# Patient Record
Sex: Female | Born: 1935 | Race: Black or African American | Hispanic: No | Smoking: Former smoker
Health system: Southern US, Community
[De-identification: ages and names within clinical notes are randomized; demographics above are authoritative.]

## PROBLEM LIST (undated history)

## (undated) DIAGNOSIS — F482 Pseudobulbar affect: Secondary | ICD-10-CM

## (undated) DIAGNOSIS — E785 Hyperlipidemia, unspecified: Secondary | ICD-10-CM

## (undated) DIAGNOSIS — I609 Nontraumatic subarachnoid hemorrhage, unspecified: Secondary | ICD-10-CM

## (undated) DIAGNOSIS — K5900 Constipation, unspecified: Secondary | ICD-10-CM

## (undated) DIAGNOSIS — F419 Anxiety disorder, unspecified: Secondary | ICD-10-CM

## (undated) DIAGNOSIS — N183 Chronic kidney disease, stage 3 unspecified: Secondary | ICD-10-CM

## (undated) DIAGNOSIS — I5032 Chronic diastolic (congestive) heart failure: Secondary | ICD-10-CM

## (undated) DIAGNOSIS — I10 Essential (primary) hypertension: Secondary | ICD-10-CM

## (undated) DIAGNOSIS — G459 Transient cerebral ischemic attack, unspecified: Secondary | ICD-10-CM

## (undated) DIAGNOSIS — G309 Alzheimer's disease, unspecified: Secondary | ICD-10-CM

## (undated) DIAGNOSIS — F0280 Dementia in other diseases classified elsewhere without behavioral disturbance: Secondary | ICD-10-CM

## (undated) HISTORY — PX: OTHER SURGICAL HISTORY: SHX169

---

## 1997-12-16 ENCOUNTER — Emergency Department (HOSPITAL_COMMUNITY): Admission: EM | Admit: 1997-12-16 | Discharge: 1997-12-16 | Payer: Self-pay | Admitting: Emergency Medicine

## 1997-12-19 ENCOUNTER — Emergency Department (HOSPITAL_COMMUNITY): Admission: EM | Admit: 1997-12-19 | Discharge: 1997-12-19 | Payer: Self-pay | Admitting: Emergency Medicine

## 1997-12-31 ENCOUNTER — Encounter: Payer: Self-pay | Admitting: Neurosurgery

## 1997-12-31 ENCOUNTER — Inpatient Hospital Stay (HOSPITAL_COMMUNITY): Admission: RE | Admit: 1997-12-31 | Discharge: 1998-01-01 | Payer: Self-pay | Admitting: Neurosurgery

## 2000-03-14 ENCOUNTER — Other Ambulatory Visit: Admission: RE | Admit: 2000-03-14 | Discharge: 2000-03-14 | Payer: Self-pay | Admitting: Internal Medicine

## 2000-08-10 ENCOUNTER — Emergency Department (HOSPITAL_COMMUNITY): Admission: EM | Admit: 2000-08-10 | Discharge: 2000-08-10 | Payer: Self-pay | Admitting: Emergency Medicine

## 2000-08-10 ENCOUNTER — Encounter: Payer: Self-pay | Admitting: Emergency Medicine

## 2000-08-15 ENCOUNTER — Encounter: Admission: RE | Admit: 2000-08-15 | Discharge: 2000-08-15 | Payer: Self-pay | Admitting: Internal Medicine

## 2000-08-15 ENCOUNTER — Encounter: Payer: Self-pay | Admitting: Internal Medicine

## 2002-04-16 ENCOUNTER — Encounter: Payer: Self-pay | Admitting: Urology

## 2002-04-16 ENCOUNTER — Encounter: Admission: RE | Admit: 2002-04-16 | Discharge: 2002-04-16 | Payer: Self-pay | Admitting: Urology

## 2003-03-31 ENCOUNTER — Ambulatory Visit (HOSPITAL_COMMUNITY): Admission: RE | Admit: 2003-03-31 | Discharge: 2003-03-31 | Payer: Self-pay | Admitting: Neurosurgery

## 2003-04-28 ENCOUNTER — Ambulatory Visit (HOSPITAL_COMMUNITY): Admission: RE | Admit: 2003-04-28 | Discharge: 2003-04-28 | Payer: Self-pay | Admitting: Obstetrics and Gynecology

## 2004-04-09 ENCOUNTER — Ambulatory Visit (HOSPITAL_COMMUNITY): Admission: RE | Admit: 2004-04-09 | Discharge: 2004-04-09 | Payer: Self-pay | Admitting: Obstetrics and Gynecology

## 2004-06-08 ENCOUNTER — Other Ambulatory Visit: Admission: RE | Admit: 2004-06-08 | Discharge: 2004-06-08 | Payer: Self-pay | Admitting: Obstetrics and Gynecology

## 2004-09-11 ENCOUNTER — Inpatient Hospital Stay (HOSPITAL_COMMUNITY): Admission: EM | Admit: 2004-09-11 | Discharge: 2004-09-22 | Payer: Self-pay | Admitting: Emergency Medicine

## 2004-09-24 ENCOUNTER — Inpatient Hospital Stay (HOSPITAL_COMMUNITY): Admission: EM | Admit: 2004-09-24 | Discharge: 2004-09-28 | Payer: Self-pay | Admitting: Emergency Medicine

## 2004-09-24 ENCOUNTER — Encounter: Admission: RE | Admit: 2004-09-24 | Discharge: 2004-09-29 | Payer: Self-pay | Admitting: Neurosurgery

## 2004-09-24 ENCOUNTER — Ambulatory Visit: Payer: Self-pay | Admitting: Physical Medicine & Rehabilitation

## 2004-09-27 ENCOUNTER — Encounter (INDEPENDENT_AMBULATORY_CARE_PROVIDER_SITE_OTHER): Payer: Self-pay | Admitting: Cardiology

## 2006-12-22 ENCOUNTER — Inpatient Hospital Stay (HOSPITAL_COMMUNITY): Admission: EM | Admit: 2006-12-22 | Discharge: 2006-12-25 | Payer: Self-pay | Admitting: Emergency Medicine

## 2006-12-25 ENCOUNTER — Encounter (INDEPENDENT_AMBULATORY_CARE_PROVIDER_SITE_OTHER): Payer: Self-pay | Admitting: Emergency Medicine

## 2006-12-25 ENCOUNTER — Ambulatory Visit: Payer: Self-pay | Admitting: Vascular Surgery

## 2007-10-24 ENCOUNTER — Encounter: Admission: RE | Admit: 2007-10-24 | Discharge: 2008-01-22 | Payer: Self-pay | Admitting: Internal Medicine

## 2007-12-26 ENCOUNTER — Encounter: Admission: RE | Admit: 2007-12-26 | Discharge: 2008-01-21 | Payer: Self-pay | Admitting: Internal Medicine

## 2008-09-24 ENCOUNTER — Inpatient Hospital Stay (HOSPITAL_COMMUNITY): Admission: EM | Admit: 2008-09-24 | Discharge: 2008-10-03 | Payer: Self-pay | Admitting: Emergency Medicine

## 2008-09-25 ENCOUNTER — Encounter (INDEPENDENT_AMBULATORY_CARE_PROVIDER_SITE_OTHER): Payer: Self-pay | Admitting: Internal Medicine

## 2008-09-25 ENCOUNTER — Ambulatory Visit: Payer: Self-pay | Admitting: Vascular Surgery

## 2008-09-25 ENCOUNTER — Encounter (INDEPENDENT_AMBULATORY_CARE_PROVIDER_SITE_OTHER): Payer: Self-pay | Admitting: *Deleted

## 2009-01-28 ENCOUNTER — Emergency Department (HOSPITAL_COMMUNITY): Admission: EM | Admit: 2009-01-28 | Discharge: 2009-01-28 | Payer: Self-pay | Admitting: Emergency Medicine

## 2009-07-17 ENCOUNTER — Inpatient Hospital Stay (HOSPITAL_COMMUNITY): Admission: EM | Admit: 2009-07-17 | Discharge: 2009-07-24 | Payer: Self-pay | Admitting: Emergency Medicine

## 2009-07-18 ENCOUNTER — Ambulatory Visit: Payer: Self-pay | Admitting: Gastroenterology

## 2009-07-21 ENCOUNTER — Encounter: Payer: Self-pay | Admitting: Internal Medicine

## 2009-10-11 ENCOUNTER — Emergency Department (HOSPITAL_COMMUNITY)
Admission: EM | Admit: 2009-10-11 | Discharge: 2009-10-11 | Payer: Self-pay | Source: Home / Self Care | Admitting: Emergency Medicine

## 2010-05-16 ENCOUNTER — Emergency Department (HOSPITAL_COMMUNITY)
Admission: EM | Admit: 2010-05-16 | Discharge: 2010-05-17 | Payer: Self-pay | Source: Home / Self Care | Admitting: Emergency Medicine

## 2010-05-24 LAB — CBC
HCT: 43.6 % (ref 36.0–46.0)
Hemoglobin: 14.3 g/dL (ref 12.0–15.0)
MCH: 30.9 pg (ref 26.0–34.0)
MCHC: 32.8 g/dL (ref 30.0–36.0)
MCV: 94.2 fL (ref 78.0–100.0)
Platelets: 214 10*3/uL (ref 150–400)
RBC: 4.63 MIL/uL (ref 3.87–5.11)
RDW: 14 % (ref 11.5–15.5)
WBC: 5.9 10*3/uL (ref 4.0–10.5)

## 2010-05-24 LAB — URINALYSIS, ROUTINE W REFLEX MICROSCOPIC
Nitrite: NEGATIVE
Protein, ur: 100 mg/dL — AB
Specific Gravity, Urine: 1.017 (ref 1.005–1.030)
Urine Glucose, Fasting: NEGATIVE mg/dL
Urobilinogen, UA: 1 mg/dL (ref 0.0–1.0)
pH: 8.5 — ABNORMAL HIGH (ref 5.0–8.0)

## 2010-05-24 LAB — COMPREHENSIVE METABOLIC PANEL
ALT: 12 U/L (ref 0–35)
AST: 25 U/L (ref 0–37)
Albumin: 3.7 g/dL (ref 3.5–5.2)
Alkaline Phosphatase: 79 U/L (ref 39–117)
BUN: 19 mg/dL (ref 6–23)
CO2: 27 mEq/L (ref 19–32)
Calcium: 9.8 mg/dL (ref 8.4–10.5)
Chloride: 106 mEq/L (ref 96–112)
Creatinine, Ser: 1.35 mg/dL — ABNORMAL HIGH (ref 0.4–1.2)
GFR calc Af Amer: 45 mL/min — ABNORMAL LOW (ref >60)
GFR calc non Af Amer: 37 mL/min — ABNORMAL LOW (ref >60)
Glucose, Bld: 134 mg/dL — ABNORMAL HIGH (ref 70–99)
Potassium: 4.7 mEq/L (ref 3.5–5.1)
Sodium: 145 mEq/L (ref 135–145)
Total Bilirubin: 1.2 mg/dL (ref 0.3–1.2)
Total Protein: 7.2 g/dL (ref 6.0–8.3)

## 2010-05-24 LAB — URINE MICROSCOPIC-ADD ON

## 2010-05-24 LAB — DIFFERENTIAL
Basophils Absolute: 0 10*3/uL (ref 0.0–0.1)
Basophils Relative: 0 % (ref 0–1)
Eosinophils Absolute: 0.1 10*3/uL (ref 0.0–0.7)
Eosinophils Relative: 2 % (ref 0–5)
Lymphocytes Relative: 28 % (ref 12–46)
Lymphs Abs: 1.7 10*3/uL (ref 0.7–4.0)
Monocytes Absolute: 0.6 10*3/uL (ref 0.1–1.0)
Monocytes Relative: 11 % (ref 3–12)
Neutro Abs: 3.5 10*3/uL (ref 1.7–7.7)
Neutrophils Relative %: 60 % (ref 43–77)

## 2010-05-28 ENCOUNTER — Emergency Department (HOSPITAL_COMMUNITY)
Admission: EM | Admit: 2010-05-28 | Discharge: 2010-05-28 | Payer: Self-pay | Source: Home / Self Care | Admitting: Emergency Medicine

## 2010-05-31 LAB — POCT CARDIAC MARKERS
CKMB, poc: 3.2 ng/mL (ref 1.0–8.0)
CKMB, poc: 2.3 ng/mL (ref 1.0–8.0)
Myoglobin, poc: 265 ng/mL (ref 12–200)
Myoglobin, poc: 310 ng/mL (ref 12–200)

## 2010-05-31 LAB — RAPID URINE DRUG SCREEN, HOSP PERFORMED
Amphetamines: NOT DETECTED
Benzodiazepines: NOT DETECTED
Tetrahydrocannabinol: NOT DETECTED

## 2010-05-31 LAB — CBC
HCT: 43.8 % (ref 36.0–46.0)
Hemoglobin: 15.2 g/dL — ABNORMAL HIGH (ref 12.0–15.0)
MCHC: 34.7 g/dL (ref 30.0–36.0)
RBC: 4.86 MIL/uL (ref 3.87–5.11)
RDW: 13.8 % (ref 11.5–15.5)
WBC: 6.4 10*3/uL (ref 4.0–10.5)

## 2010-05-31 LAB — TROPONIN I: Troponin I: 0.01 ng/mL (ref 0.00–0.06)

## 2010-05-31 LAB — DIFFERENTIAL
Basophils Absolute: 0 10*3/uL (ref 0.0–0.1)
Eosinophils Relative: 1 % (ref 0–5)
Lymphs Abs: 2 10*3/uL (ref 0.7–4.0)
Neutro Abs: 3.6 10*3/uL (ref 1.7–7.7)
Neutrophils Relative %: 56 % (ref 43–77)

## 2010-05-31 LAB — URINALYSIS, ROUTINE W REFLEX MICROSCOPIC
Protein, ur: NEGATIVE mg/dL
Urobilinogen, UA: 1 mg/dL (ref 0.0–1.0)
pH: 5.5 (ref 5.0–8.0)

## 2010-05-31 LAB — URINE MICROSCOPIC-ADD ON

## 2010-06-01 LAB — URINE CULTURE: Colony Count: >=100000

## 2010-06-01 LAB — POCT I-STAT, CHEM 8
BUN: 14 mg/dL (ref 6–23)
Calcium, Ion: 1 mmol/L — ABNORMAL LOW (ref 1.12–1.32)
Creatinine, Ser: 1.3 mg/dL — ABNORMAL HIGH (ref 0.4–1.2)
Glucose, Bld: 114 mg/dL — ABNORMAL HIGH (ref 70–99)
TCO2: 28 mmol/L (ref 0–100)

## 2010-06-08 NOTE — Procedures (Signed)
Summary: Upper Endoscopy  Patient: [name] Note: All result statuses are Final unless otherwise noted.  Tests: (1) Upper Endoscopy (EGD)   EGD Upper Endoscopy       DONE     [facility]     [address]     [place], [place]  [address]           ENDOSCOPY PROCEDURE REPORT           PATIENT:  Wanda Hughes, Wanda Hughes  MR#:  [id number]     BIRTHDATE:  June 10, 1924, 85 yrs. old  GENDER:  female           ENDOSCOPIST:  Iva Boop, MD, [facility]     Referred by:  [facility]           PROCEDURE DATE:  07/21/2009     PROCEDURE:  EGD, diagnostic     ASA CLASS:  Class III     INDICATIONS:  hemeoccult positive stool, iron deficiency anemia           MEDICATIONS:   Fentanyl 40 mcg IV, Versed 3 mg IV     TOPICAL ANESTHETIC:  none           DESCRIPTION OF PROCEDURE:   After the risks benefits and     alternatives of the procedure were thoroughly explained, informed     consent was obtained.  The EG-[id number] ([id number]) and [id number]     ([id number]) endoscope was introduced through the mouth and advanced     to the second portion of the duodenum, without limitations.  The     instrument was slowly withdrawn as the mucosa was fully examined.     <<PROCEDUREIMAGES>>           A hiatal hernia was found. It was 3 cm in size. 37-40 cm     Otherwise the examination was normal.    Retroflexed views revealed     a hiatal hernia.    The scope was then withdrawn from the patient     and the procedure completed.           COMPLICATIONS:  None           ENDOSCOPIC IMPRESSION:     1) 3 cm hiatal hernia     2) Otherwise normal examination - no cause of heme +     iron-deficiency anemia seen     RECOMMENDATIONS:     More prep for colonoscopy - brief look with colonoscope into     rectum revealed inadequate prep.           REPEAT EXAM:  as needed           Iva Boop, MD, [facility]           CC:  Jackie Plum MD           n.     Rosalie Doctor:   Iva Boop at 07/21/2009 12:30  PM           Wengert, Okey Dupre, [phone]  Note: An exclamation mark (!) indicates a result that was not dispersed into the flowsheet. Document Creation Date: 07/21/2009 12:31 PM _______________________________________________________________________  (1) Order result status: Final Collection or observation date-time: 07/21/2009 12:25 Requested date-time:  Receipt date-time:  Reported date-time:  Referring Physician:   Ordering Physician: Stan Head [phone]) Specimen Source:  Source: Launa Grill Order Number: [id number] Lab site:

## 2010-08-01 LAB — CBC
HCT: 30.4 % — ABNORMAL LOW (ref 36.0–46.0)
HCT: 30.5 % — ABNORMAL LOW (ref 36.0–46.0)
HCT: 33 % — ABNORMAL LOW (ref 36.0–46.0)
HCT: 34.6 % — ABNORMAL LOW (ref 36.0–46.0)
Hemoglobin: 7.1 g/dL — ABNORMAL LOW (ref 12.0–15.0)
Hemoglobin: 9.7 g/dL — ABNORMAL LOW (ref 12.0–15.0)
MCHC: 30.7 g/dL (ref 30.0–36.0)
MCHC: 31.2 g/dL (ref 30.0–36.0)
MCHC: 31.5 g/dL (ref 30.0–36.0)
MCHC: 31.8 g/dL (ref 30.0–36.0)
MCV: 70.6 fL — ABNORMAL LOW (ref 78.0–100.0)
MCV: 71 fL — ABNORMAL LOW (ref 78.0–100.0)
MCV: 71.8 fL — ABNORMAL LOW (ref 78.0–100.0)
MCV: 72.4 fL — ABNORMAL LOW (ref 78.0–100.0)
MCV: 72.5 fL — ABNORMAL LOW (ref 78.0–100.0)
Platelets: 202 10*3/uL (ref 150–400)
Platelets: 206 10*3/uL (ref 150–400)
Platelets: 210 10*3/uL (ref 150–400)
Platelets: 229 10*3/uL (ref 150–400)
RBC: 3.35 MIL/uL — ABNORMAL LOW (ref 3.87–5.11)
RBC: 4.32 MIL/uL (ref 3.87–5.11)
RBC: 4.55 MIL/uL (ref 3.87–5.11)
RBC: 4.78 MIL/uL (ref 3.87–5.11)
RDW: 20 % — ABNORMAL HIGH (ref 11.5–15.5)
WBC: 3.7 10*3/uL — ABNORMAL LOW (ref 4.0–10.5)
WBC: 3.7 10*3/uL — ABNORMAL LOW (ref 4.0–10.5)
WBC: 4.5 10*3/uL (ref 4.0–10.5)
WBC: 6.1 10*3/uL (ref 4.0–10.5)

## 2010-08-01 LAB — CROSSMATCH

## 2010-08-01 LAB — BASIC METABOLIC PANEL
BUN: 11 mg/dL (ref 6–23)
BUN: 14 mg/dL (ref 6–23)
BUN: 16 mg/dL (ref 6–23)
CO2: 28 mEq/L (ref 19–32)
CO2: 29 mEq/L (ref 19–32)
CO2: 30 mEq/L (ref 19–32)
Calcium: 9.2 mg/dL (ref 8.4–10.5)
Calcium: 9.4 mg/dL (ref 8.4–10.5)
Chloride: 103 mEq/L (ref 96–112)
Chloride: 103 mEq/L (ref 96–112)
Chloride: 105 mEq/L (ref 96–112)
Chloride: 109 mEq/L (ref 96–112)
Creatinine, Ser: 1.03 mg/dL (ref 0.4–1.2)
Creatinine, Ser: 1.08 mg/dL (ref 0.4–1.2)
Creatinine, Ser: 1.18 mg/dL (ref 0.4–1.2)
Creatinine, Ser: 1.38 mg/dL — ABNORMAL HIGH (ref 0.4–1.2)
GFR calc Af Amer: 55 mL/min — ABNORMAL LOW (ref >60)
GFR calc Af Amer: 59 mL/min — ABNORMAL LOW (ref >60)
GFR calc non Af Amer: 48 mL/min — ABNORMAL LOW (ref >60)
Potassium: 3.3 mEq/L — ABNORMAL LOW (ref 3.5–5.1)
Potassium: 3.4 mEq/L — ABNORMAL LOW (ref 3.5–5.1)
Potassium: 3.7 mEq/L (ref 3.5–5.1)
Sodium: 142 mEq/L (ref 135–145)
Sodium: 144 mEq/L (ref 135–145)

## 2010-08-01 LAB — DIFFERENTIAL
Band Neutrophils: 0 % (ref 0–10)
Eosinophils Absolute: 0.1 10*3/uL (ref 0.0–0.7)
Eosinophils Relative: 4 % (ref 0–5)
Metamyelocytes Relative: 0 %
Monocytes Absolute: 0.1 10*3/uL (ref 0.1–1.0)
Monocytes Relative: 4 % (ref 3–12)
Myelocytes: 0 %
nRBC: 0 /100 WBC

## 2010-08-01 LAB — COMPREHENSIVE METABOLIC PANEL
ALT: 24 U/L (ref 0–35)
AST: 25 U/L (ref 0–37)
Alkaline Phosphatase: 88 U/L (ref 39–117)
CO2: 29 mEq/L (ref 19–32)
GFR calc Af Amer: 57 mL/min — ABNORMAL LOW (ref >60)
GFR calc non Af Amer: 47 mL/min — ABNORMAL LOW (ref >60)
Glucose, Bld: 146 mg/dL — ABNORMAL HIGH (ref 70–99)
Potassium: 3.7 mEq/L (ref 3.5–5.1)
Sodium: 142 mEq/L (ref 135–145)
Total Protein: 6.7 g/dL (ref 6.0–8.3)

## 2010-08-01 LAB — POCT CARDIAC MARKERS
CKMB, poc: 1.7 ng/mL (ref 1.0–8.0)
Troponin i, poc: <0.05 ng/mL (ref 0.00–0.09)

## 2010-08-01 LAB — HEMOGLOBIN AND HEMATOCRIT, BLOOD
HCT: 31.3 % — ABNORMAL LOW (ref 36.0–46.0)
HCT: 31.8 % — ABNORMAL LOW (ref 36.0–46.0)
Hemoglobin: 9.9 g/dL — ABNORMAL LOW (ref 12.0–15.0)

## 2010-08-01 LAB — PROTIME-INR
INR: 1.04 (ref 0.00–1.49)
Prothrombin Time: 13.5 seconds (ref 11.6–15.2)

## 2010-08-01 LAB — FERRITIN: Ferritin: 14 ng/mL (ref 10–291)

## 2010-08-01 LAB — IRON AND TIBC
Iron: 38 ug/dL — ABNORMAL LOW (ref 42–135)
TIBC: 406 ug/dL (ref 250–470)

## 2010-08-01 LAB — APTT: aPTT: 26 seconds (ref 24–37)

## 2010-08-17 LAB — URINALYSIS, DIPSTICK ONLY
Ketones, ur: 15 mg/dL — AB
Nitrite: NEGATIVE
Specific Gravity, Urine: 1.023 (ref 1.005–1.030)
pH: 5 (ref 5.0–8.0)

## 2010-08-17 LAB — CBC
HCT: 34.4 % — ABNORMAL LOW (ref 36.0–46.0)
HCT: 43.9 % (ref 36.0–46.0)
Hemoglobin: 12.9 g/dL (ref 12.0–15.0)
MCHC: 33.5 g/dL (ref 30.0–36.0)
MCHC: 33.6 g/dL (ref 30.0–36.0)
MCV: 89.5 fL (ref 78.0–100.0)
Platelets: 123 10*3/uL — ABNORMAL LOW (ref 150–400)
Platelets: 200 10*3/uL (ref 150–400)
RBC: 4.26 MIL/uL (ref 3.87–5.11)
RDW: 14.6 % (ref 11.5–15.5)
WBC: 5 10*3/uL (ref 4.0–10.5)
WBC: 7.2 10*3/uL (ref 4.0–10.5)

## 2010-08-17 LAB — URINALYSIS, ROUTINE W REFLEX MICROSCOPIC
Bilirubin Urine: NEGATIVE
Glucose, UA: NEGATIVE mg/dL
Protein, ur: NEGATIVE mg/dL
Urobilinogen, UA: 0.2 mg/dL (ref 0.0–1.0)

## 2010-08-17 LAB — CULTURE, BLOOD (ROUTINE X 2)
Culture: NO GROWTH
Culture: NO GROWTH
Culture: NO GROWTH

## 2010-08-17 LAB — COMPREHENSIVE METABOLIC PANEL
ALT: 11 U/L (ref 0–35)
CO2: 29 mEq/L (ref 19–32)
Calcium: 9.9 mg/dL (ref 8.4–10.5)
Creatinine, Ser: 1.06 mg/dL (ref 0.4–1.2)
GFR calc non Af Amer: 50 mL/min — ABNORMAL LOW (ref >60)
Glucose, Bld: 81 mg/dL (ref 70–99)
Sodium: 144 mEq/L (ref 135–145)
Total Protein: 7.6 g/dL (ref 6.0–8.3)

## 2010-08-17 LAB — URINE CULTURE

## 2010-08-17 LAB — BASIC METABOLIC PANEL
BUN: 16 mg/dL (ref 6–23)
CO2: 26 mEq/L (ref 19–32)
CO2: 26 mEq/L (ref 19–32)
Calcium: 8.9 mg/dL (ref 8.4–10.5)
Chloride: 112 mEq/L (ref 96–112)
Creatinine, Ser: 1.09 mg/dL (ref 0.4–1.2)
Creatinine, Ser: 1.12 mg/dL (ref 0.4–1.2)
GFR calc Af Amer: 56 mL/min — ABNORMAL LOW (ref >60)
GFR calc non Af Amer: 47 mL/min — ABNORMAL LOW (ref >60)
Potassium: 3.4 mEq/L — ABNORMAL LOW (ref 3.5–5.1)
Sodium: 145 mEq/L (ref 135–145)

## 2010-08-17 LAB — URINE MICROSCOPIC-ADD ON

## 2010-08-17 LAB — POCT CARDIAC MARKERS
CKMB, poc: 2.1 ng/mL (ref 1.0–8.0)
Myoglobin, poc: 185 ng/mL (ref 12–200)

## 2010-08-17 LAB — GLUCOSE, CAPILLARY: Glucose-Capillary: 70 mg/dL (ref 70–99)

## 2010-08-17 LAB — POCT I-STAT 3, ART BLOOD GAS (G3+)
Bicarbonate: 29.3 mEq/L — ABNORMAL HIGH (ref 20.0–24.0)
TCO2: 31 mmol/L (ref 0–100)
pCO2 arterial: 43.9 mmHg (ref 35.0–45.0)
pH, Arterial: 7.432 — ABNORMAL HIGH (ref 7.350–7.400)
pO2, Arterial: 75 mmHg — ABNORMAL LOW (ref 80.0–100.0)

## 2010-08-17 LAB — DIFFERENTIAL
Lymphocytes Relative: 38 % (ref 12–46)
Lymphs Abs: 1.4 10*3/uL (ref 0.7–4.0)
Monocytes Relative: 7 % (ref 3–12)
Neutro Abs: 2.1 10*3/uL (ref 1.7–7.7)
Neutrophils Relative %: 54 % (ref 43–77)

## 2010-08-17 LAB — PROTIME-INR: INR: 1 (ref 0.00–1.49)

## 2010-08-17 LAB — MAGNESIUM: Magnesium: 2.2 mg/dL (ref 1.5–2.5)

## 2010-08-17 LAB — TSH: TSH: 3.036 u[IU]/mL (ref 0.350–4.500)

## 2010-08-17 LAB — LACTIC ACID, PLASMA: Lactic Acid, Venous: 1.4 mmol/L (ref 0.5–2.2)

## 2010-08-17 LAB — HEMOGLOBIN A1C: Mean Plasma Glucose: 128 mg/dL

## 2010-08-17 LAB — LIPID PANEL
HDL: 47 mg/dL (ref >39)
LDL Cholesterol: 226 mg/dL — ABNORMAL HIGH (ref 0–99)
Triglycerides: 150 mg/dL — ABNORMAL HIGH (ref <150)

## 2010-08-17 LAB — CALCIUM: Calcium: 8.9 mg/dL (ref 8.4–10.5)

## 2010-08-17 LAB — APTT: aPTT: 25 seconds (ref 24–37)

## 2010-09-21 NOTE — H&P (Signed)
NAME:  Wanda Hughes, Wanda Hughes                   ACCOUNT NO.:  [id number]   MEDICAL RECORD NO.:  [id number]          PATIENT TYPE:  INP   LOCATION:  1826                         FACILITY:  [facility]   PHYSICIAN:  Lonia Blood, M.D.       DATE OF BIRTH:  1924/12/29   DATE OF ADMISSION:  12/22/2006  DATE OF DISCHARGE:                              HISTORY & PHYSICAL   PRIMARY CARE PHYSICIAN:  Dr. Renae Gloss   CHIEF COMPLAINT:  Passed out.   HISTORY OF PRESENT ILLNESS:  Wanda Hughes is an 77 year old woman who was  brought in by EMS for an episode of loss of consciousness at the  hairdresser.  According to the witnesses, the patient just passed out,  slumped in a chair and when she woke up, she could not talk well and she  was weak on the right side.  The patient herself has no recollection of  the event.  The sister was with her at the hairdresser and apparently  denied to the EMS presence of any shakiness, tongue biting.  Currently,  the patient reports to me that she feels perfect.  When asked about her  past medical history, the patient does not remember having a  subarachnoid hemorrhage in 2005, but the records indeed confirms that.  Also, past medical history is hypertension, appendectomy.  In the past,  the patient also had a syncopal event in May of 2006.   HOME MEDICATIONS:  The patient claims that she takes a blood pressure  three times a day and she does not remember the name.  When I called her  pharmacy, they tell me that patient has been on Hyzaar 100/25, but she  has not filled it since May.   SOCIAL HISTORY:  Patient lives alone.  She is retired from [employer].  She has three sons; they all live out of state.  She reports that her  granddaughter stays with her at times.  She has been driving around town  and doing her activities of daily living alone.   FAMILY HISTORY:  Noncontributory.   REVIEW OF SYSTEMS:  Negative for chest pain, negative for current focal  weakness and numbness,  negative for nausea, vomiting, abdominal pain.  Positive for some urinary incontinence.   PHYSICAL EXAMINATION UP ADMISSION:  VITAL SIGNS:  Show a temperature of  97.2, blood pressure 134/57, pulse 67, respirations 18, saturation 100%  on room air.  GENERAL APPEARANCE:  Tall, well-developed African-American woman sitting  on a stretcher in no acute distress.  She is oriented to place; she  knows the year; she does not know the date, does not know the month,  does not know the day of the week.  HEENT:  Her head appears normocephalic, atraumatic and her pupils equal,  round and reactive to light and accommodation.  Extraocular movements  intact.  Throat clear.  NECK:  Supple, has +1 JVD.  CHEST:  Clear without wheeze, rhonchi, crackles.  HEART:  Regular rate and rhythm with 2/6 systolic murmur at the right  upper sternal border.  ABDOMEN:  Soft,  nontender, nondistended.  Bowel sounds are present.  No  palpable hepatosplenomegaly.  EXTREMITIES:  Lower extremities have no edema.  NEUROLOGICAL EXAMINATION:  Patient has intact repetition speech, short  memory; she will remember 2/3 objects.  Cranial nerves III-XII are  intact.  Sensation intact in all four extremities.  Deep tendon reflexes  +1 and symmetric.  Strength 5/5.  Gait normal just shuffling slow, but  otherwise without any clear problems.  Finger-to-nose is intact.  No  cerebellar signs.   LABORATORY VALUES AT THE TIME OF ADMISSION:  Sodium 139, potassium 2.8,  chloride 104, BUN 19, creatinine 1.2.  White blood cell count 4.2,  hemoglobin 12.9, platelet count 181. Myoglobin 106.  Troponin I less  than 0.05.   Head CT did not show any acute changes.   ASSESSMENT/PLAN:  1. Syncope with probable transient ischemic attack.  The exact cause      of the patient's symptom is unclear.  She is on no medications to      account for her loss of consciousness.  She does not have extensive      coronary artery disease or valvulopathy to  account for an      arrhythmia or a sudden change in cardiac output.  She does not seem      to have sepsis or major infectious problem.  Her neurological exam      is currently within normal limits.  The patient may have had      orthostasis and we will check the orthostatics.  Other possibility      could be that the patient indeed had a transient ischemic attack or      a small lacunar stroke, so for this reason we will perform frequent      neurological examination and will obtain an MRI/MRA of the brain.      Carotid Dopplers will be done as well.  Transthoracic      echocardiogram will be obtain and will further evaluate the      systolic murmur which I suspect is related to some aortic      sclerosis.  2. Mild cognitive deficit.  Wanda Hughes seems to have some red flags that      may indicate that she is developing dementia.  Definitely will      pursue with the check of TSH, vitamin B12 and RPR and defer the      long-term planning to her primary care physician who may have a      better idea about the baseline of this patient.      Lonia Blood, M.D.  Electronically Signed     SL/MEDQ  D:  12/22/2006  T:  12/23/2006  Job:  [id number]

## 2010-09-21 NOTE — Discharge Summary (Signed)
NAME:  Wanda Hughes, Pasch Cherene                   ACCOUNT NO.:  [id number]   MEDICAL RECORD NO.:  [id number]          PATIENT TYPE:  INP   LOCATION:  3036                         FACILITY:  [facility]   PHYSICIAN:  Charlestine Massed, MDDATE OF BIRTH:  01-20-1925   DATE OF ADMISSION:  09/24/2008  DATE OF DISCHARGE:                               DISCHARGE SUMMARY   PRIMARY CARE PHYSICIAN:  Dr. Andi Devon as per previous records.   CURRENT DISPOSITION:  The patient will be discharged back to skilled  nursing facility.   REASON FOR ADMISSION:  Altered mental status.   DISCHARGE DIAGNOSES:  1. Moderate-to-severe dementia with significant memory impairment with      decreased level of functioning.  2. Urinary tract infection with sepsis, currently resolving.  3. Altered mental status secondary to moderate-to-severe dementia and      urinary tract infection with sepsis.  Currently, she is not      agitated.  4. Dyslipidemia.  5. Hypertension.  6. Previous history of subarachnoid hemorrhage and transient ischemic      attack.  7. Previous history of nonsustained ventricular tachycardia, currently      no issues.  8. History of right vertebral artery stenosis.   MEDICATIONS:  A full list of medications will be dictated at the time of  discharge.  The patient is currently on IV medications and that will be  changed and dictation will be done at the time of discharge for  medications.   HOSPITAL COURSE BY PROBLEM:  1. Altered mental status.  The altered mental status in her case is      multifactorial.      a.     Dementia with decreased level of functioning and memory       impairment.  The patient has had a prior history of dementia and       currently she has progressive dementia with decreased level of       function.  She is currently not able to take care of herself at       home.  Family was spoken to, her son agreed that she will need a       skilled nursing facility.  The list of  facilities have been given       to the family and a Child psychotherapist is working with them.  The       patient will be ready for discharge in another 1 day possibly,       pending urine culture report.      b.     Urinary tract infection with sepsis.  The patient had       urinary tract infection and was diagnosed with urinary tract       infection.  While during hospital stay, she was having fevers, was       started on Zosyn in view of the fact that her mental status       declined considerably.  After the antibiotics, the patient       improved very much and came back to baseline  level of functioning.       She is eating by herself.  No dysphagia.  The urine culture is       still not back.  I spoke to Microbiology Lab now and it will be       available only by tomorrow morning.  Pending urine cultures, the       patient was continued on Zosyn and can be changed to appropriate       p.o. antibiotics and can be discharged tomorrow if feasible as per       the urine culture.  2. Hypertension.  Currently blood pressure is stable.  We will      continue Norvasc and labetalol.  3. Dyslipidemia.  Continue Zetia and Zocor.  4. Dementia.  Continue trazodone 25 mg p.o. at bedtime, and we can      also continue Aricept and Namenda for her dementia.  Even though      there is not much data available, sporadic cases have demonstrated      some decrease progression of the dementia.   DISPOSITION:  The patient will be discharged back to skilled nursing  facility.   FOLLOWUP:  Follow up MD at skilled nursing facility to follow up the  patient and further checkups as deemed necessary to be decided by the MD  there.   A total of 40 minutes spent on the discharge evaluation today.      Charlestine Massed, MD  Electronically Signed     UT/MEDQ  D:  09/30/2008  T:  10/01/2008  Job:  [id number]

## 2010-09-21 NOTE — H&P (Signed)
NAME:  Wanda Hughes, Wanda Hughes                   ACCOUNT NO.:  [id number]   MEDICAL RECORD NO.:  [id number]          PATIENT TYPE:  EMS   LOCATION:  MAJO                         FACILITY:  [facility]   PHYSICIAN:  Manus Gunning, MD      DATE OF BIRTH:  04-09-1925   DATE OF ADMISSION:  09/24/2008  DATE OF DISCHARGE:                              HISTORY & PHYSICAL   CHIEF COMPLAINT:  Altered mental status.   HISTORY OF PRESENT ILLNESS:  Wanda Hughes is an 77 year old African American  female who was apparently brought to the emergency department by her  family as she was found confused and lying in a pool of her own urine.  During the time of my interview no family members were present by the  bedside so all history has been obtained either from ED sign-out or from  previous past medical records.  I have spoken with the patient.  She is  a very pleasant lady but appears to be confused.  She was disoriented to  time and place, oriented to person, confused about her date of birth.  Though she does get the year and month right the date itself is wrong.  She claims she does not know why she was brought to the emergency  department and apparently lives alone to the best of my knowledge.  She  cannot really provide any meaningful history though she does follow all  commands appropriately and is able to converse in an appropriate manner  as well.  She is easily confused and appears to suffer from mild to  moderate dementia at baseline, though this cannot be corroborated as  again no family members are at the bedside.  Also of note, She has three  pill boxes with her.  All three of the medications were filled in 2009  in apparently January and still have pills left in the boxes.  Her blood  pressure at the time of presentation was 224/83.  During my interview it  was 168/76 and her heart rate was in the low 60s.  A review of her EKG  demonstrates left anterior fascicular block with sinus rhythm, a heart  rate of  approximately 70 beats per minute.  The patient herself appears  to be like as mentioned appropriate, follows all commands, does not  appear to have any focal neurological deficits.  Her laboratory tests as  reviewed by self did not demonstrate any overt findings of concern, but  again based on her chief complaint, am unable to corroborate her  baseline mentation and her history.  At this time we will admit her to  the hospital.   PAST MEDICAL/SURGICAL HISTORY:  1. Appendectomy.  2. Hypertension.  3. Subarachnoid hemorrhage.  4. TIA.  5. Dyslipidemia.  6. Dementia.  7. History of non-sustained ventricular tachycardia.  8. History of right vertebral artery stenosis.   SOCIAL HISTORY:  Denies tobacco, illicits or alcohol.  Apparently lives  at home alone.  Has three sons who live out of town, and has  granddaughter who lives occasionally with her.  Again, this  is all from  previous histories.   FAMILY HISTORY:  Cannot be provided by the patient. She does not recall.   ALLERGIES:  No known drug allergies.Marland Kitchen   HOME MEDICATIONS:  To the best of my knowledge from the pill boxes that  are lying by her bedside which were all filled in January 2009 are:  1. Hyzaar 100/25.  2. Caduet 10/40.  3. Oxybutynin ER 5 mg.  All tablets to be taken 1daily.   REVIEW OF SYSTEMS:  Essentially 14-point review of systems performed  with the patient.  She denies loss of consciousness, denies syncope,  denies falls although the patient cannot recall why she is here.  Denies  chest pain, palpitations, PND, orthopnea.  No shortness of breath, cough  or expectoration.  Claims that she lives independently and can perform  her activities of daily living.  No abdominal pain, nausea, vomiting,  diarrhea, constipation, dysuria, polyuria, hematuria, bright red blood  per rectum or melanotic stool.  Denies any overt musculoskeletal  complaints at this time.   PHYSICAL EXAMINATION:  VITALS AT TIME OF  PRESENTATION:  Temperature  98.1, heart rate 59, respiratory rate 18, blood pressure 224/83.  O2  saturation 100% on room air.  GENERAL:  Well-nourished, well-developed African American lady lying in  bed comfortably in no apparent distress.  HEENT:  Normocephalic, atraumatic.  Dry oral mucosa.  No thrush,  erythema, postanal drip.  Eyes:  Anicteric.  Extraocular muscles intact.  Pupils are equal and react to light and accommodation.  CARDIOVASCULAR:  S1.  S2 is single, no split.  No murmurs, rubs or  gallops.  RS:  Air entry is bilaterally equal. No rales, rhonchi or wheezes.  ABDOMEN:  Soft, nontender, nondistended.  Positive bowel sounds.  No  organomegaly.  EXTREMITIES:  No cyanosis or clubbing.  +1 bilateral lower extremity  edema.  HEME/ONC:  No palpable lymphadenopathy, ecchymosis, bruising or  petechiae.  SKIN:  No breakdown, swelling or ulcerations, masses.  NECK:  Supple.  Good range of motion.  No carotid bruits.  Neck veins  appear to be flat.   LABORATORY TESTS:  UA demonstrates zero WBCs, RBCs 3-6.  There is mucous  present.  Urinalysis demonstrates negative nitrite,negative leukocytes.  Lactic acid 1.4.  Sodium 144, potassium 3.9, chloride 103, CO2 was 29,  glucose 81, BUN 15, creatinine 1.06, T-bili 1.4, alk phos 83, AST 16,  ALT 11, total protein 7.6, albumin 3.8, calcium 9.9, creatinine kinase  40.  PTT 25, myoglobin 185.  Troponin-I less than 0.05.  CK-MB 2.1.  WBC  3.9, hemoglobin 14.8, hematocrit 43.9, platelet count 123, poly morphs  54.  ABG demonstrates pH of 7.43, pCO2 of 43.9, pO2 of 75, bicarbonate  29.3, CO2 of 31.   ASSESSMENT/PLAN:  1. Altered mental status, apparently urinary incontinence.  Again,      history cannot be corroborated.  Patient is confused at baseline      during my interview.  Whether this is her actual baseline or      whether this is a new finding it cannot be corroborated.  I have      attempted to reach her next-of-kin who is Kellie Shropshire at [phone] but      unable to reach anybody.  At this time we will admit her and we      will rule her out for transient ischemic attack and obtain an MRI      of the head, ultrasound bilateral carotids and  two-dimensional      echocardiogram to assess systolic function and cardiac anatomy.  I      started her on aspirin 325 mg p.o. daily and allow permissive      hypertension.  Her systolic blood pressures were approximately 160      mmHg.  2. Hypertension.  At the time of presentation appears to be      hypertensive urgency and her presenting symptoms could be secondary      to encephalopathy secondary to high blood pressure.  At this time      as dictated above I will allow permissive hypertension and start      Norvasc 2.5 mg p.o. daily and titrate up to maximum tolerated to      assist and obtain a blood pressure of approximately 160 mmHg.      Gradually as an outpatient we can add on additional medications and      titrate to goal of approximately 110-120 mmHg.  3. Altered mental status and dementia.  The patient has a history of      dementia per past records.  Whether the current presentation is      based on or not I am unsure, though I would guess that she is at      her baseline based on pleasant conversation and ability to follow      all commands.  Regardless, her social status is unknown.  There are      no family members at the bedside.  She has medicines which have      been brought with her but again of note, these have been filled in      January of 2009 and August of 2009 and still have pills present in      the pill boxes.  We will obtain a case management consult and also      check physical therapy, occupational therapy and speech therapy      evaluation to asses patient's needs and possible placement if need      require until we can obtain a family member or we can figure out      how she lives at home.  4. Gastrointestinal/deep vein thrombosis prophylaxis.   Protonix 40 mg      p.o. daily and sequential compression devices for now.      Manus Gunning, MD  Electronically Signed     SP/MEDQ  D:  09/24/2008  T:  09/24/2008  Job:  [id number]

## 2010-09-21 NOTE — Discharge Summary (Signed)
NAME:  Wanda Hughes, Wanda Hughes                   ACCOUNT NO.:  [id number]   MEDICAL RECORD NO.:  [id number]          PATIENT TYPE:  INP   LOCATION:  6708                         FACILITY:  [facility]   PHYSICIAN:  Mobolaji B. Bakare, M.D.DATE OF BIRTH:  09-13-1924   DATE OF ADMISSION:  12/22/2006  DATE OF DISCHARGE:  12/25/2006                               DISCHARGE SUMMARY   PRIMARY CARE PHYSICIAN:  Cala Bradford R. Renae Gloss, M.D.   FINAL DIAGNOSIS:  1. Transient ischemic attack.  2. Hypertension.  3. Dyslipidemia.  4. Nonsustained ventricular tachycardia and bigeminy.  5. Urinary tract infection.  6. History of subarachnoid hemorrhage in May 2006.  7. Dementia.  8. History of right vertebral artery stenosis.   PROCEDURES:  1. Head CT scan done on December 22, 2006, showed no acute intracranial      process, no significant change from CT done in May 2006.      Periventricular right and subinsular white matter disease and      cortical atrophy.  2. MRA of the head done on December 22, 2006, showed no acute      intracranial abnormality.  No acute stroke. Chronic small vessel      disease.  3. MRA of the neck with contrast showed arteriosclerotic disease most      prominent in origin of the nondominant right vertebral artery with      significant stenosis. Distal E4 segment of the right vertebral      artery with high-grade stenosis, left common carotid origin without      hemodynamically significant stenosis but carotid bifurcations right      greater than left without hemodynamically significant stenosis.  4. MRA of the head showed atherosclerotic disease involving the      basilar artery and carotid siphons, right greater than left, with      moderate right subsphenoid and right internal carotid stenosis.      Distal posterior cerebral artery arteriosclerosis, right greater      than left.  Absent, hypoplastic right A1 variant.  5. Carotid Dopplers showed no significant internal carotid stenosis.  6.  A 2-D echocardiogram report is pending.   BRIEF HISTORY:  Wanda Hughes is a pleasant 77 year old African-American  female who was brought to the emergency room by EMS.  She was at the  hairdresser today.  The patient passed out, slumped over in the chair.  She was reported to have had difficulty talking and was weak on the  right side.  The patient could not recollect these events.  There was no  evidence of seizures.  The patient recovered before arrival in the  emergency room. At the time of evaluation in the emergency room, she had  no neurological deficits.  She was alert and oriented.  However, she had  some short-term memory loss noted.  Vital signs upon arrival revealed  temperature 97.2, blood pressure 154/70, pulse of 67, respiratory  rate  18, O2 saturation of 100% on room air. The patient had no focal  neurological deficits on evaluation. No cerebellar signs.  She was  subsequently admitted for TIA workup and stroke workup.   HOSPITAL COURSE:  #1.  TRANSIENT ISCHEMIC ATTACK:  Given the history, it was compatible  with transient ischemic attack.  She has risk factors of hypertension  and dyslipidemia.  The patient does not smoke cigarettes.  She is not  diabetic.  She had a normal TSH of 1.637. Hemoglobin A1c normal at 6.1.  Homocysteine level normal at 9.7.  Carotid Dopplers were unremarkable  for any significant lesion.  MRI noted vertebral artery stenosis which  was equally present on angiogram done in 2006, and she had  atherosclerotic disease.   The patient has not been on aspirin prior to hospitalization. She was  started on baby aspirin 81 mg daily.  Her blood pressure was relatively  controlled during the course of hospitalization but not optimal. She had  some values around 159-180 systolic. Atenolol was added to previous  medications which include Hyzaar/hydrochlorothiazide, amlodipine.  Further optimization of blood pressure will be deferred to Dr. Renae Gloss.    Additionally, fasting lipid profile showed total cholesterol of 176, HDL  of 48, LDL of 111.  She is currently on Caduet which she will continue,  and I have emphasized low-cholesterol diet. Follow up fasting lipid  profile in 2-3 months.   #2.  NONSUSTAINED VENTRICULAR TACHYCARDIA, PREMATURE VENTRICULAR  CONTRACTIONS, AND BIGEMINY:  The patient was on telemetry.  She had five  runs of nonsustained ventricular tachycardia.  She was asymptomatic.  She had intermittent bigeminy on telemetry and also frequent PVCs.  This  was not symptomatic. Blood pressure was normal.  The patient had no  palpitation.  A 2-D echocardiogram report is pending at the time of  dictation.  At the time of discharge, she was in normal sinus rhythm.  I  would mention that the patient is currently on atenolol to improve blood  pressure.   #3.  DEMENTIA:  The patient clearly demonstrated short-term memory loss.  It is noted that she is on Aricept. She had vitamin B12 and folate done  which were within normal.  RPR was not reactive. Concern is the  patient's ability to continue driving without posing a risk to herself  and to others.  I have recommended no driving until she sees Dr.  Renae Gloss, who is her  primary care physician and who is familiar with the  situation. The patient lives alone.  She was evaluated by physical and  occupational therapist, and she does not need further therapy at home.  The patient is independent with activities. Family will be able to  provide further 24-hour assistance for the next 2-3 weeks.  Her son [place]  down from [place] and will be able to assess her and  determine if she can remain at home or not.  If not, she will be seeing  Dr. Renae Gloss for further evaluation regarding assisted living facility.   #4.  URINARY TRACT INFECTION:  The patient had pyuria on admission with  positive nitrite and large leukocytes.  On urinalysis, microscopy showed  21-50 white blood cells  and many bacteria.  Unfortunately, urine culture  was not sent before antibiotic was administered. The patient was  empirically treated with ciprofloxacin.  She will complete treatment for  7 days.   DISCHARGE MEDICATIONS:  1. Baby aspirin 81 mg daily.  2. Ciprofloxacin 500 mg for three more days.  3. Atenolol 25 mg daily.  4. Hyzaar/hydrochlorothiazide 100/25 one daily.  5. Lyrica 75 mg daily.  6.  Caduet 10/40 daily.  7. Fosamax 1 weekly.  8. Ditropan XL  5 mg daily.  9. Aricept 10 mg nightly.  10.Meclizine 25 mg one to two tablets two to three times daily p.r.n.   DISCHARGE CONDITION:  Stable.   DISPOSITION:  Home with family.   LABORATORY DATA AT THE TIME OF DISCHARGE:  Sodium 141, potassium 3.3  chloride 104, bicarb 30, glucose 137, BUN 14, creatinine 0.71, calcium  9.3. (This was not a fasting specimen.)  Homocysteine level 9.7. Vitamin  B12 762. TSH 1.637. Hemoglobin A1c 6.6.      Mobolaji B. Corky Downs, M.D.  Electronically Signed     MBB/MEDQ  D:  12/25/2006  T:  12/25/2006  Job:  [id number]   cc:   Merlene Laughter. Renae Gloss, M.D.

## 2010-09-21 NOTE — Discharge Summary (Signed)
NAME:  Wanda Hughes, Wanda Hughes                   ACCOUNT NO.:  [id number]   MEDICAL RECORD NO.:  [id number]          PATIENT TYPE:  INP   LOCATION:  3036                         FACILITY:  [facility]   PHYSICIAN:  Hind I Elsaid, MD      DATE OF BIRTH:  01-30-1925   DATE OF ADMISSION:  09/24/2008  DATE OF DISCHARGE:  10/02/2008                               DISCHARGE SUMMARY   PRIMARY CARE PHYSICIAN:  Cala Bradford R. Renae Gloss, MD.   DISCHARGE DIAGNOSES:  1. Altered mental status, felt to be secondary to dementia in addition      to hypertensive urgency.  2. Altered mental status resolved at this time and the patient at      baseline mentation.  3. Hypertensive urgency.  4. Dyslipidemia.  5. Constipation.  6. Previous history of subarachnoid hemorrhage and transient ischemic      attack.  7. Previous history of nonsustained ventricular tachycardia.  8. History of right vertebral artery stenosis.  9. Empirically treated for urinary tract infection/bronchitis.  10.A 40-59% internal carotid artery stenosis upper end , no evidence      of left internal carotid artery stenosis.   DISCHARGE MEDICATIONS:  1. Aspirin 325 mg p.o. daily.  2. Aricept 10 mg p.o. nightly.  3. Zetia 10 mg p.o. nightly.  4. Labetalol 300 mg p.o. b.i.d.  5. Lisinopril 10 mg twice daily.  6. Namenda 5 mg twice daily.  7. Protonix 40 mg daily.  8. Senna 2 tablets p.o. nightly.  9. Zocor 20 mg p.o. nightly.  10.Detrol 2 mg p.o. daily.  11.Trazodone 25 mg p.o. nightly.  12.Dulcolax suppository 10 mg per rectum p.r.n.   PROCEDURES:  1. Chest x-ray, left perihilar and bibasilar atelectasis.  2. CT of head, no acute intracranial abnormality.  Chronic small      vessel ischemic disease and brain atrophy.  3. MRI of the brain, no acute infarct, atrophy, and moderate small      vessel disease.  4. Chest x-ray, interstitial densities negative for acute infiltrate      or effusion, heart contour are normal.   HISTORY OF PRESENT  ILLNESS:  This is an 77 year old African American  female brought to the emergency room secondary to confusion.  The  patient found lying in a pool of her own urine.  The patient is seen in  the emergency room.  She was disoriented to time and place and oriented  to person.  Her blood pressure in the emergency room found to be 224/83.  Her EKG demonstrate left anterior fascicular block with sinus rhythm and  heart rate of 70 per minute.  The patient did not show any neurological  deficit and admitted for evaluation of altered mental status.   HOSPITAL COURSE:  1. Altered mental status, felt to be a combination of possible      hypertensive encephalopathy in addition to baseline dementia.  MRI      was done to rule out acute ischemic attack, which was negative.      Her 2-D echo, which show ejection fraction of 65-70%,  wall motion      was normal and there were no regional wall motion abnormalities and      this show abnormal left ventricular relaxation.  Aortic valve      without any regurg or stenosis.  Mitral valve structurally normal      and there were no pericardial effusion noted.  The patient also has      carotid duplex, which did not show any significant internal carotid      artery stenosis.  Revisable cause of memory loss was done, which      include vitamin B12, which found to be within normal.  Folate      within normal.  RBR was normal.  The patient was started      empirically on Zosyn to cover for possible bronchitis and UTI.  The      patient during hospital stay improved and was felt altered mental      status could be secondary to the patient's baseline dementia in      addition to hypertensive urgency.  2. Hypertensive urgency.  The patient started on labetalol in addition      to Norvasc.  Norvasc was discontinued secondary to patient's      constipation and a stat lisinopril added.  Blood pressure remained      under good control.  3. Urinary incontinence.  Her  urine culture was negative for any      evidence of infection and Detrol was added for overactive bladder.  4. Constipation.  The patient received MiraLax and senna during      hospital stay in addition to fecal disimpaction.  5. Also an issue, swallow and speech evaluated the patient, no      evidence of aspiration observed at bedside.  The patient completed      a course of IV antibiotic and the patient has no more fever or      white blood cells.  The patient during hospital stay significantly      improved.  I felt the patient is medically stable to be discharged.      Hind Bosie Helper, MD  Electronically Signed     HIE/MEDQ  D:  10/02/2008  T:  10/02/2008  Job:  [id number]

## 2010-09-24 NOTE — H&P (Signed)
NAME:  Wanda Hughes, Wanda Hughes                   ACCOUNT NO.:  [id number]   MEDICAL RECORD NO.:  [id number]          PATIENT TYPE:  INP   LOCATION:  3706                         FACILITY:  [facility]   PHYSICIAN:  Hettie Holstein, D.O.    DATE OF BIRTH:  1924-12-09   DATE OF ADMISSION:  09/24/2004  DATE OF DISCHARGE:                                HISTORY & PHYSICAL   PRIMARY CARE PHYSICIAN:  Dr. Renae Gloss   Just released on May 17 from the neurosurgical service with a subarachnoid  hemorrhage by Dr. Jordan Likes.  Was noted to have resolved subarachnoid hemorrhage  on follow-up CT scans.   CHIEF COMPLAINT:  Syncope.   HISTORY OF PRESENT ILLNESS:  Wanda Hughes, as noted above, is a 77 year old  African-American female who lives alone who was just discharged on the 17th  from Dr. Rubye Oaks service for a subarachnoid hemorrhage which resolved  who was at her first day of rehabilitation filling out paperwork when her  son reported that she slumped over in her chair and was unresponsive for one  to two minutes.  Staff positioned her on the floor.  She slowly became  conscious and was completely alert and oriented at the time.  She was not  postictal.  She had no slurred speech or signs of seizures according to a  son and no tonic-clonic movements.  She denied any symptoms preceding,  nausea, vomiting, chest pain, or shortness of breath.  She is asymptomatic  at time of evaluation in the emergency department.  She is being admitted  for further evaluation and observation for syncopal episode.   PAST MEDICAL HISTORY:  Denies notable history of coronary artery disease,  diabetes mellitus, or lung disease.  She is status post appendectomy.  She  retains her gallbladder.  She has had recent subarachnoid hemorrhage for  which CT scan as noted above was not revealing of worsening in comparison to  prior CT scans.  CT revealed only stable atrophy and resolved subarachnoid  hemorrhage.  Hypertension.   MEDICATIONS:  1.   Clonidine 0.1 mg p.o. t.i.d. which was initiated on the 17th.  2.  Ditropan XL 5 mg daily.  3.  Hyzaar 50/4.5 daily.   ALLERGIES:  CODEINE for which she feels nausea.   SOCIAL HISTORY:  She quit smoking 35 years ago.  She lives alone.  She has  three children.  She was a former Consulting civil engineer for [employer].  She has  assistance for all of her cooking and cleaning at home.  She uses a walker  to ambulate.   FAMILY HISTORY:  Noncontributory.   REVIEW OF SYSTEMS:  The patient denies any nausea, vomiting, diarrhea, chest  pain, shortness of breath.  She has had no fevers or cough, no dizziness, or  swelling of her lower extremities.   PHYSICAL EXAMINATION:  VITAL SIGNS:  Reviewed in the emergency department.  Systolic was 106, diastolic 85, heart rate 64, temperature 98.9,  respirations 18.  Her lying blood pressure is 106/85, standing blood  pressure is 155/71, lying heart rate 64, standing  62.  GENERAL:  Patient is pleasant, alert, and oriented 77 year old female in no  acute distress.  NECK:  Supple, nontender.  No palpable thyromegaly or mass.  HEENT:  Oropharynx is clear.  LUNGS:  Clear to auscultation bilaterally.  She exhibited normal effort.  There was no dullness to percussion.  ABDOMEN:  Soft, nontender.  No palpable hepatosplenomegaly or mass.  No  suprapubic or costovertebral tenderness is noted.   LABORATORY DATA:  Sodium 138, potassium 4.1, BUN 14, creatinine 0.8, glucose  98.  AST/ALT 19/20, albumin 2.9.  WBC 10.6, hemoglobin 14.5, platelet count  158, MCV 92.  CT of the head revealed resolved subarachnoid hemorrhage and  stable atrophy.  Initial cardiac markers were negative.   ASSESSMENT:  1.  Syncope.  2.  Recent subarachnoid hemorrhage.  3.  Hypertension.   PLAN:  At this time we are going to admit to telemetry floor for observation  for arrhythmia, check a 2-D echocardiogram to evaluate LV function, check  carotid Dopplers for posterior circulation, and  follow her course  clinically.  Will ambulate her.  Will hold clonidine for now.  Administer  gentle IV fluids and check a urinalysis as this was not performed in the  emergency department.  Will follow her course clinically.      ESS/MEDQ  D:  09/24/2004  T:  09/24/2004  Job:  [id number]   cc:   Merlene Laughter. Renae Gloss, M.D.  [address]  [address]  [place]  [place] [address]  Fax: [phone]

## 2010-09-24 NOTE — Procedures (Signed)
CONCLUSION:  This was a normal EEG for the patient's age and conscious  state. The patient was described as awake and drowsy.      HK:VQQV  D:  09/27/2004 12:44:44  T:  09/27/2004 13:13:40  Job #:  [id number]

## 2010-09-24 NOTE — Discharge Summary (Signed)
NAME:  Wanda Hughes, Wanda Hughes                   ACCOUNT NO.:  [id number]   MEDICAL RECORD NO.:  [id number]          PATIENT TYPE:  INP   LOCATION:  3017                         FACILITY:  [facility]   PHYSICIAN:  Sherilyn Cooter A. Pool, M.D.    DATE OF BIRTH:  [birth year]   DATE OF ADMISSION:  09/11/2004  DATE OF DISCHARGE:  09/22/2004                                 DISCHARGE SUMMARY   FINAL DIAGNOSIS:  Grade 3 posterior fossa subarachnoid hemorrhage.   HISTORY OF PRESENT ILLNESS:  [name] is a 77 year old female who suffered a  spontaneous subarachnoid hemorrhage. The patient was taken the emergency  room for evaluation. Workup demonstrated evidence of a posterior fossa  subarachnoid hemorrhage.   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  The patient's vascular anatomy was unfavorable for surgery as was her  advanced age. Therefore, the patient was elected to be monitored in the  intensive care unit with subarachnoid hemorrhage precautions and blood  pressure control. The patient did surprisingly well with this hemorrhage.  She did not develop symptoms of hydrocephalus. Her headache gradually  cleared. Her mental status returned to baseline. She was able to be  discharged home. At time of discharge, the patient is awake and alert, more  appropriate. She was following commands readily with all four extremities.  She is be discharged home with her family.           ______________________________  Kathaleen Maser Pool, M.D.     HAP/MEDQ  D:  12/28/2004  T:  12/28/2004  Job:  [id number]

## 2010-09-24 NOTE — Discharge Summary (Signed)
NAME:  Wanda, Wanda Hughes                   ACCOUNT NO.:  [id number]   MEDICAL RECORD NO.:  [id number]          PATIENT TYPE:  INP   LOCATION:  3706                         FACILITY:  [facility]   PHYSICIAN:  Hettie Holstein, D.O.    DATE OF BIRTH:  01-12-1925   DATE OF ADMISSION:  09/24/2004  DATE OF DISCHARGE:  09/27/2004                                 DISCHARGE SUMMARY   ADMISSION DIAGNOSIS:  1.  Syncope with a recent subarachnoid hemorrhage at time of discharge.  2.  Syncope, patient evaluation no etiologies determined.  She underwent      computed tomography scan with stable findings.  There was no evidence of      recurrent bleed.  She underwent magnetic resonance imaging of the brain      without evidence of stroke.  She underwent 2-D echocardiogram which      revealed ejection fraction of 55-65% with electroencephalogram that was      in addition unremarkable.  3.  Hypertension, controlled.  The patient was stable on Cozaar in addition      to Norvasc which she will be going home on and this can be adjusted or      changed at the discretion of her primary care physician.  Asked her to      follow up with this in the next week.  4.  Urinary tract infection.  Will continue treatment course with      ciprofloxacin.  She was started on the 19th and should conclude on the      25th.   MEDICATIONS ON DISCHARGE:  1.  Ditropan XL 5 mg daily as before.  She was on Hyzaar and are holding      this for now.  2.  She can take Cozaar for now 50 mg daily.  3.  Norvasc 5 mg daily with plans for adjustment in the outpatient setting.  4.  Over-the-counter Tylenol.  5.  Ciprofloxacin 250 mg twice daily until May 25.   DISPOSITION:  She was instructed to call Dr. Jomarie Longs for follow up  appointment this week.  She is to continue her rehab and as previously  scheduled.  Wanda Hughes is a pleasant 77 year old female who lives alone and was recently  discharged, on the 17th, from Dr. Rubye Oaks service for a  subarachnoid  hemorrhage which resolved, and she was undergoing her first day of  stabilization.  While filling out paperwork, her son reported that she  slumped over in the chair and was unresponsive for a couple of minutes.  She  regained consciousness and totally became conscious, was completely alert  and oriented at that time.  No description of postictal state was described.  No slurred speech or sign of stroke.  In any event, she was  admitted for evaluation of syncope.  She underwent investigations without  significant findings.  She was suspected to have a urinary tract infection.  At this time, she is felt to be medically stable for discharge and will can  follow up with the primary care physician.  ESS/MEDQ  D:  09/28/2004  T:  09/28/2004  Job:  [id number]   cc:   Theone Stanley, MD   Kathaleen Maser Pool, M.D.  [address]  [place]  [place] [address]  Fax: [phone]

## 2010-11-21 ENCOUNTER — Emergency Department (HOSPITAL_COMMUNITY): Payer: Medicare Other

## 2010-11-21 ENCOUNTER — Emergency Department (HOSPITAL_COMMUNITY)
Admission: EM | Admit: 2010-11-21 | Discharge: 2010-11-21 | Disposition: A | Discharge status: Home / Self Care | Payer: Medicare Other | Attending: Emergency Medicine | Admitting: Emergency Medicine

## 2010-11-21 DIAGNOSIS — I10 Essential (primary) hypertension: Secondary | ICD-10-CM | POA: Insufficient documentation

## 2010-11-21 DIAGNOSIS — W19XXXA Unspecified fall, initial encounter: Secondary | ICD-10-CM | POA: Insufficient documentation

## 2010-11-21 DIAGNOSIS — F068 Other specified mental disorders due to known physiological condition: Secondary | ICD-10-CM | POA: Insufficient documentation

## 2010-11-21 DIAGNOSIS — R4182 Altered mental status, unspecified: Secondary | ICD-10-CM | POA: Insufficient documentation

## 2010-11-21 DIAGNOSIS — Y9210 Unspecified residential institution as the place of occurrence of the external cause: Secondary | ICD-10-CM | POA: Insufficient documentation

## 2010-11-21 DIAGNOSIS — S0100XA Unspecified open wound of scalp, initial encounter: Secondary | ICD-10-CM | POA: Insufficient documentation

## 2010-11-21 DIAGNOSIS — S01501A Unspecified open wound of lip, initial encounter: Secondary | ICD-10-CM | POA: Insufficient documentation

## 2011-02-18 LAB — COMPREHENSIVE METABOLIC PANEL
ALT: 15
Alkaline Phosphatase: 73
CO2: 27
Calcium: 8.9
GFR calc non Af Amer: >60
Glucose, Bld: 117 — ABNORMAL HIGH
Sodium: 140

## 2011-02-18 LAB — HOMOCYSTEINE: Homocysteine: 9.7

## 2011-02-18 LAB — CBC
HCT: 38.2
MCHC: 33.7
MCV: 92.1
MCV: 92.6
Platelets: 181
RBC: 4.46
RDW: 13.6
RDW: 13.7
WBC: 4.2

## 2011-02-18 LAB — URINALYSIS, ROUTINE W REFLEX MICROSCOPIC
Ketones, ur: NEGATIVE
Nitrite: POSITIVE — AB
Specific Gravity, Urine: 1.01
pH: 6

## 2011-02-18 LAB — PROTIME-INR: INR: 1

## 2011-02-18 LAB — URINE MICROSCOPIC-ADD ON

## 2011-02-18 LAB — I-STAT 8, (EC8 V) (CONVERTED LAB)
BUN: 19
Bicarbonate: 26 — ABNORMAL HIGH
Chloride: 104
Glucose, Bld: 145 — ABNORMAL HIGH
pCO2, Ven: 46.1
pH, Ven: 7.36 — ABNORMAL HIGH

## 2011-02-18 LAB — BASIC METABOLIC PANEL
BUN: 14
Calcium: 9.3
Chloride: 104
Creatinine, Ser: 0.71
GFR calc Af Amer: >60
GFR calc non Af Amer: >60

## 2011-02-18 LAB — DIFFERENTIAL
Basophils Absolute: 0
Eosinophils Absolute: 0
Eosinophils Relative: 1
Neutrophils Relative %: 60

## 2011-02-18 LAB — LIPID PANEL
HDL: 48
VLDL: 17

## 2011-02-18 LAB — POCT CARDIAC MARKERS
Myoglobin, poc: 106
Operator id: 161631

## 2011-02-18 LAB — APTT: aPTT: 34

## 2011-02-18 LAB — POCT I-STAT CREATININE: Creatinine, Ser: 1.2

## 2011-02-18 LAB — MAGNESIUM: Magnesium: 2.1

## 2011-02-18 LAB — RPR: RPR Ser Ql: NONREACTIVE

## 2011-02-18 LAB — TROPONIN I: Troponin I: 0.02

## 2011-02-18 LAB — TSH: TSH: 1.637

## 2011-02-18 LAB — CK TOTAL AND CKMB (NOT AT ARMC): Total CK: 164

## 2011-02-18 LAB — HEMOGLOBIN A1C: Mean Plasma Glucose: 140

## 2011-10-30 ENCOUNTER — Emergency Department (HOSPITAL_COMMUNITY): Payer: Medicare Other

## 2011-10-30 ENCOUNTER — Encounter (HOSPITAL_COMMUNITY): Payer: Self-pay | Admitting: *Deleted

## 2011-10-30 ENCOUNTER — Emergency Department (HOSPITAL_COMMUNITY)
Admission: EM | Admit: 2011-10-30 | Discharge: 2011-10-30 | Disposition: A | Discharge status: Home / Self Care | Payer: Medicare Other | Attending: Emergency Medicine | Admitting: Emergency Medicine

## 2011-10-30 DIAGNOSIS — F0280 Dementia in other diseases classified elsewhere without behavioral disturbance: Secondary | ICD-10-CM | POA: Insufficient documentation

## 2011-10-30 DIAGNOSIS — Y9210 Unspecified residential institution as the place of occurrence of the external cause: Secondary | ICD-10-CM | POA: Insufficient documentation

## 2011-10-30 DIAGNOSIS — Z79899 Other long term (current) drug therapy: Secondary | ICD-10-CM | POA: Insufficient documentation

## 2011-10-30 DIAGNOSIS — G309 Alzheimer's disease, unspecified: Secondary | ICD-10-CM | POA: Insufficient documentation

## 2011-10-30 DIAGNOSIS — M5030 Other cervical disc degeneration, unspecified cervical region: Secondary | ICD-10-CM | POA: Insufficient documentation

## 2011-10-30 DIAGNOSIS — S0083XA Contusion of other part of head, initial encounter: Secondary | ICD-10-CM

## 2011-10-30 DIAGNOSIS — G9389 Other specified disorders of brain: Secondary | ICD-10-CM | POA: Insufficient documentation

## 2011-10-30 DIAGNOSIS — S0003XA Contusion of scalp, initial encounter: Secondary | ICD-10-CM | POA: Insufficient documentation

## 2011-10-30 DIAGNOSIS — E785 Hyperlipidemia, unspecified: Secondary | ICD-10-CM | POA: Insufficient documentation

## 2011-10-30 DIAGNOSIS — G319 Degenerative disease of nervous system, unspecified: Secondary | ICD-10-CM | POA: Insufficient documentation

## 2011-10-30 DIAGNOSIS — W050XXA Fall from non-moving wheelchair, initial encounter: Secondary | ICD-10-CM | POA: Insufficient documentation

## 2011-10-30 DIAGNOSIS — W19XXXA Unspecified fall, initial encounter: Secondary | ICD-10-CM

## 2011-10-30 DIAGNOSIS — S1093XA Contusion of unspecified part of neck, initial encounter: Secondary | ICD-10-CM | POA: Insufficient documentation

## 2011-10-30 HISTORY — DX: Essential (primary) hypertension: I10

## 2011-10-30 HISTORY — DX: Dementia in other diseases classified elsewhere, unspecified severity, without behavioral disturbance, psychotic disturbance, mood disturbance, and anxiety: F02.80

## 2011-10-30 HISTORY — DX: Alzheimer's disease, unspecified: G30.9

## 2011-10-30 HISTORY — DX: Constipation, unspecified: K59.00

## 2011-10-30 HISTORY — DX: Hyperlipidemia, unspecified: E78.5

## 2011-10-30 NOTE — Discharge Instructions (Signed)
Return here for any worsening in her condition.  Followup with your Dr. for recheck

## 2011-10-30 NOTE — ED Notes (Signed)
Patient refused to be taken back with son, ptar was call ed and then cancelled after son inform staff he wanted to take patient back. Then ptar called again to complete transport. Son was very difficult to communicate with and upset about length of stay in ED. Informed charge nurse of incident. Son consoled and  Discharge completed and [facility] took patient back to [facility]

## 2011-10-30 NOTE — ED Provider Notes (Signed)
History     CSN: [id number]  Arrival date & time 10/30/11  1644   First MD Initiated Contact with Patient 10/30/11 1702      Chief Complaint  Patient presents with  . Genia Hotter from Wheelchair    (Consider location/radiation/quality/duration/timing/severity/associated sxs/prior treatment) HPI Patient is sitting in her wheelchair at the nursing home, when she slipped out and hit the ground.  Patient has a hematoma to the right upper forehead.  Patient had no syncope, chest pain, shortness of breath,  vomiting,  or fever.  Nursing home reports that the patient did not lose consciousness. Past Medical History  Diagnosis Date  . Hypertension   . Dementia in Alzheimer's disease   . Constipation   . Dyslipidemia     Past Surgical History  Procedure Date  . Dyslipidemia     History reviewed. No pertinent family history.  History  Substance Use Topics  . Smoking status: Not on file  . Smokeless tobacco: Former Neurosurgeon  . Alcohol Use: No    OB History    Grav Para Term Preterm Abortions TAB SAB Ect Mult Living                  Review of Systems Level V, caveat applies due to dementia Allergies  Ace inhibitors  Home Medications   Current Outpatient Rx  Name Route Sig Dispense Refill  . AMLODIPINE BESYLATE 5 MG PO TABS Oral Take 5 mg by mouth daily.    . ATORVASTATIN CALCIUM 40 MG PO TABS Oral Take 40 mg by mouth daily.    Marland Kitchen CLONIDINE HCL 0.2 MG/24HR TD PTWK Transdermal Place 2 patches onto the skin once a week. Pt places 2 patches on Thursday. For a total dose of 0.4mg     . DONEPEZIL HCL 10 MG PO TABS Oral Take 10 mg by mouth at bedtime.    Marland Kitchen EZETIMIBE 10 MG PO TABS Oral Take 10 mg by mouth daily.    . FUROSEMIDE 40 MG PO TABS Oral Take 40 mg by mouth daily. Pt takes 40 mg at 8p    . FUROSEMIDE 80 MG PO TABS Oral Take 80 mg by mouth daily. Pt takes 80 mg at 12p    . HYPROMELLOSE 2.5 % OP SOLN Both Eyes Place 1 drop into both eyes daily as needed. Dry eyes    .  LABETALOL HCL 300 MG PO TABS Oral Take 300 mg by mouth 2 (two) times daily.    Marland Kitchen LATANOPROST 0.005 % OP SOLN Both Eyes Place 1 drop into both eyes at bedtime.    Marland Kitchen MEMANTINE HCL 10 MG PO TABS Oral Take 10 mg by mouth 2 (two) times daily.    Marland Kitchen POLYETHYLENE GLYCOL 3350 PO PACK Oral Take 17 g by mouth daily.    Marland Kitchen POTASSIUM CHLORIDE CRYS ER 20 MEQ PO TBCR Oral Take 20 mEq by mouth daily.    Marland Kitchen PRENATAL MULTIVITAMIN CH Oral Take 1 tablet by mouth daily.    Marland Kitchen PRESCRIPTION MEDICATION Topical Apply 1 application topically 2 (two) times daily. Lorazepam gel 1mg / ml Compounded at [facility].    . QUETIAPINE FUMARATE 50 MG PO TABS Oral Take 50 mg by mouth at bedtime.    . SENNA 8.6 MG PO TABS Oral Take 2 tablets by mouth daily.    . SERTRALINE HCL 50 MG PO TABS Oral Take 50 mg by mouth daily.    Marland Kitchen VITAMIN C 500 MG PO TABS Oral Take  500 mg by mouth 2 (two) times daily.      There were no vitals taken for this visit.  Physical Exam  Nursing note and vitals reviewed. Constitutional: She appears well-developed and well-nourished. No distress.  HENT:  Head: Normocephalic.    Right Ear: Tympanic membrane normal.  Left Ear: Tympanic membrane normal.  Nose: Nose normal.  Mouth/Throat: Uvula is midline and oropharynx is clear and moist.  Eyes: EOM are normal. Pupils are equal, round, and reactive to light.  Cardiovascular: Normal rate and regular rhythm.   Pulmonary/Chest: Effort normal and breath sounds normal.  Musculoskeletal:       Cervical back: Normal.       Thoracic back: Normal.       Lumbar back: Normal.  Neurological: She is alert. She exhibits normal muscle tone. Coordination normal.    ED Course  Procedures (including critical care time)  Labs Reviewed - No data to display Ct Head Wo Contrast  10/30/2011  *RADIOLOGY REPORT*  Clinical Data:  Larey Seat.  Hit head.  CT HEAD WITHOUT CONTRAST CT CERVICAL SPINE WITHOUT CONTRAST  Technique:  Multidetector CT imaging of the head and cervical  spine was performed following the standard protocol without intravenous contrast.  Multiplanar CT image reconstructions of the cervical spine were also generated.  Comparison:  Multiple prior studies.  CT HEAD  Findings: Stable age related cerebral atrophy, ventriculomegaly and periventricular white matter disease.  No extra-axial fluid collections are identified.  No CT findings for acute hemispheric infarction and/or intracranial hemorrhage.  A small right frontal scalp hematoma is noted.  No underlying skull fracture.  The paranasal sinuses and mastoid air cells are clear. The globes are intact.  IMPRESSION:  1.  Stable age related cerebral atrophy, ventriculomegaly and periventricular white matter disease. 2.  No acute intracranial findings or skull fracture.  CT CERVICAL SPINE  Findings: Stable cervical fusion hardware anteriorly at C4-5 and C5- 6.  Pseudoarthrosis is noted.  There is severe degenerative disc disease again demonstrated at C3-4, C6-7, T1-2 and T2-3. The alignment is maintained.  No acute fracture.  The facets are normally aligned.  Stable facet disease.  The skull base C1 and C1- 2 articulations are maintained.  Stable multilevel foraminal stenosis due to uncinate spurring and facet disease.  Dense carotid artery calcifications are noted.  IMPRESSION:  1. Stable alignment of the cervical vertebral bodies with advanced degenerative cervical spondylosis. 2.  Anterior fusion hardware at C4-5 and C5-6 with pseudoarthrosis. 3.  Stable advanced degenerative disc disease at C3-4, C6-7, T1-2 and T2-3. 4.  No acute fracture.  Original Report Authenticated By: P. Loralie Champagne, M.D.   Ct Cervical Spine Wo Contrast  10/30/2011  *RADIOLOGY REPORT*  Clinical Data:  Larey Seat.  Hit head.  CT HEAD WITHOUT CONTRAST CT CERVICAL SPINE WITHOUT CONTRAST  Technique:  Multidetector CT imaging of the head and cervical spine was performed following the standard protocol without intravenous contrast.  Multiplanar CT  image reconstructions of the cervical spine were also generated.  Comparison:  Multiple prior studies.  CT HEAD  Findings: Stable age related cerebral atrophy, ventriculomegaly and periventricular white matter disease.  No extra-axial fluid collections are identified.  No CT findings for acute hemispheric infarction and/or intracranial hemorrhage.  A small right frontal scalp hematoma is noted.  No underlying skull fracture.  The paranasal sinuses and mastoid air cells are clear. The globes are intact.  IMPRESSION:  1.  Stable age related cerebral atrophy, ventriculomegaly and periventricular white matter disease.  2.  No acute intracranial findings or skull fracture.  CT CERVICAL SPINE  Findings: Stable cervical fusion hardware anteriorly at C4-5 and C5- 6.  Pseudoarthrosis is noted.  There is severe degenerative disc disease again demonstrated at C3-4, C6-7, T1-2 and T2-3. The alignment is maintained.  No acute fracture.  The facets are normally aligned.  Stable facet disease.  The skull base C1 and C1- 2 articulations are maintained.  Stable multilevel foraminal stenosis due to uncinate spurring and facet disease.  Dense carotid artery calcifications are noted.  IMPRESSION:  1. Stable alignment of the cervical vertebral bodies with advanced degenerative cervical spondylosis. 2.  Anterior fusion hardware at C4-5 and C5-6 with pseudoarthrosis. 3.  Stable advanced degenerative disc disease at C3-4, C6-7, T1-2 and T2-3. 4.  No acute fracture.  Original Report Authenticated By: P. Loralie Champagne, M.D.    Patient has normal CT scans of the head and neck.  Patient has no deficits noted on exam.  Is advised to followup with her primary care Dr. for recheck.  Return here for any worsening in her condition.  Son, states, that patient has are normal mentation and Disposition. MDM         Carlyle Dolly, PA-C 10/30/11 2011

## 2011-10-30 NOTE — ED Notes (Signed)
Patient fell from wheelchair hitting her head on floor. Hematoma noted to right side of forehead. Patient has advanced dementia son at bedside. Patient from nursing home.

## 2011-10-30 NOTE — ED Notes (Signed)
AVW:[id number]<XB> Expected date:10/30/11<BR> Expected time: 4:35 PM<BR> Means of arrival:Ambulance<BR> Comments:<BR> M80. 86 f. Slip and fall out of wheelchair, from facility. Hematoma above eye. Acting per normal, no neck/back pain, no bloodthinners. 12-15 mins

## 2011-10-30 NOTE — ED Notes (Signed)
Patient is being discharge. Patient son will take patient back to [facility]. Via private vehicle. - facility is aware. Inform ed charge nurse of ptar was not needed. Discharge instruction was given to patient and son.

## 2011-10-30 NOTE — ED Notes (Signed)
Pt son at bedside.  Pt son wants to take pt back to facility.   Pt stable.  Pt son verbalizes understanding

## 2011-10-31 NOTE — ED Provider Notes (Signed)
Medical screening examination/treatment/procedure(s) were performed by non-physician practitioner and as supervising physician I was immediately available for consultation/collaboration.  Flint Melter, MD 10/31/11 [phone]

## 2013-07-07 ENCOUNTER — Encounter (HOSPITAL_COMMUNITY): Payer: Self-pay | Admitting: Emergency Medicine

## 2013-07-07 ENCOUNTER — Emergency Department (HOSPITAL_COMMUNITY)
Admission: EM | Admit: 2013-07-07 | Discharge: 2013-07-07 | Disposition: A | Discharge status: Skilled Nursing Facility | Payer: Medicare Other | Attending: Emergency Medicine | Admitting: Emergency Medicine

## 2013-07-07 ENCOUNTER — Emergency Department (HOSPITAL_COMMUNITY): Payer: Medicare Other

## 2013-07-07 DIAGNOSIS — Y92129 Unspecified place in nursing home as the place of occurrence of the external cause: Secondary | ICD-10-CM

## 2013-07-07 DIAGNOSIS — Z862 Personal history of diseases of the blood and blood-forming organs and certain disorders involving the immune mechanism: Secondary | ICD-10-CM | POA: Insufficient documentation

## 2013-07-07 DIAGNOSIS — I10 Essential (primary) hypertension: Secondary | ICD-10-CM | POA: Insufficient documentation

## 2013-07-07 DIAGNOSIS — G309 Alzheimer's disease, unspecified: Secondary | ICD-10-CM | POA: Insufficient documentation

## 2013-07-07 DIAGNOSIS — Z8719 Personal history of other diseases of the digestive system: Secondary | ICD-10-CM | POA: Insufficient documentation

## 2013-07-07 DIAGNOSIS — S0083XA Contusion of other part of head, initial encounter: Principal | ICD-10-CM

## 2013-07-07 DIAGNOSIS — R296 Repeated falls: Secondary | ICD-10-CM | POA: Insufficient documentation

## 2013-07-07 DIAGNOSIS — S0003XA Contusion of scalp, initial encounter: Secondary | ICD-10-CM | POA: Insufficient documentation

## 2013-07-07 DIAGNOSIS — S59909A Unspecified injury of unspecified elbow, initial encounter: Secondary | ICD-10-CM | POA: Insufficient documentation

## 2013-07-07 DIAGNOSIS — Y9210 Unspecified residential institution as the place of occurrence of the external cause: Secondary | ICD-10-CM | POA: Insufficient documentation

## 2013-07-07 DIAGNOSIS — S6990XA Unspecified injury of unspecified wrist, hand and finger(s), initial encounter: Secondary | ICD-10-CM | POA: Insufficient documentation

## 2013-07-07 DIAGNOSIS — S59919A Unspecified injury of unspecified forearm, initial encounter: Secondary | ICD-10-CM

## 2013-07-07 DIAGNOSIS — W19XXXA Unspecified fall, initial encounter: Secondary | ICD-10-CM

## 2013-07-07 DIAGNOSIS — F0280 Dementia in other diseases classified elsewhere without behavioral disturbance: Secondary | ICD-10-CM | POA: Insufficient documentation

## 2013-07-07 DIAGNOSIS — Z8639 Personal history of other endocrine, nutritional and metabolic disease: Secondary | ICD-10-CM | POA: Insufficient documentation

## 2013-07-07 DIAGNOSIS — S1093XA Contusion of unspecified part of neck, initial encounter: Principal | ICD-10-CM

## 2013-07-07 DIAGNOSIS — Y939 Activity, unspecified: Secondary | ICD-10-CM | POA: Insufficient documentation

## 2013-07-07 LAB — BASIC METABOLIC PANEL
BUN: 15 mg/dL (ref 6–23)
CALCIUM: 9.3 mg/dL (ref 8.4–10.5)
CO2: 25 meq/L (ref 19–32)
Chloride: 105 mEq/L (ref 96–112)
Creatinine, Ser: 0.69 mg/dL (ref 0.50–1.10)
GFR calc Af Amer: 88 mL/min — ABNORMAL LOW (ref >90)
GFR, EST NON AFRICAN AMERICAN: 76 mL/min — AB (ref >90)
GLUCOSE: 98 mg/dL (ref 70–99)
Potassium: 4.4 mEq/L (ref 3.7–5.3)
SODIUM: 143 meq/L (ref 137–147)

## 2013-07-07 LAB — CBC WITH DIFFERENTIAL/PLATELET
BASOS PCT: 0 % (ref 0–1)
Basophils Absolute: 0 10*3/uL (ref 0.0–0.1)
EOS ABS: 0.1 10*3/uL (ref 0.0–0.7)
EOS PCT: 1 % (ref 0–5)
HCT: 45.9 % (ref 36.0–46.0)
Hemoglobin: 15.5 g/dL — ABNORMAL HIGH (ref 12.0–15.0)
Lymphocytes Relative: 33 % (ref 12–46)
Lymphs Abs: 2.7 10*3/uL (ref 0.7–4.0)
MCH: 30.9 pg (ref 26.0–34.0)
MCHC: 33.8 g/dL (ref 30.0–36.0)
MCV: 91.6 fL (ref 78.0–100.0)
Monocytes Absolute: 0.8 10*3/uL (ref 0.1–1.0)
Monocytes Relative: 10 % (ref 3–12)
Neutro Abs: 4.6 10*3/uL (ref 1.7–7.7)
Neutrophils Relative %: 56 % (ref 43–77)
PLATELETS: 179 10*3/uL (ref 150–400)
RBC: 5.01 MIL/uL (ref 3.87–5.11)
RDW: 13.6 % (ref 11.5–15.5)
WBC: 8.2 10*3/uL (ref 4.0–10.5)

## 2013-07-07 LAB — URINALYSIS, ROUTINE W REFLEX MICROSCOPIC
Bilirubin Urine: NEGATIVE
Glucose, UA: NEGATIVE mg/dL
HGB URINE DIPSTICK: NEGATIVE
KETONES UR: NEGATIVE mg/dL
Leukocytes, UA: NEGATIVE
Nitrite: NEGATIVE
PROTEIN: NEGATIVE mg/dL
Specific Gravity, Urine: 1.019 (ref 1.005–1.030)
UROBILINOGEN UA: 0.2 mg/dL (ref 0.0–1.0)
pH: 7 (ref 5.0–8.0)

## 2013-07-07 NOTE — ED Notes (Signed)
Patient transported to X-ray 

## 2013-07-07 NOTE — ED Notes (Signed)
Lackey PA at bedside.

## 2013-07-07 NOTE — ED Notes (Signed)
Patient discharged back to nursing home with PTAR. NAD.

## 2013-07-07 NOTE — ED Provider Notes (Signed)
Patient [facility] in the Emergency Department [facility] Exam : awake/alert, appears confused, forehead contusion noted, maex4, no focal extremity trauma noted Plan: CT imaging of head and if negative likely d/c back to nursing facility   Joya Gaskinsonald W Shaiann Mcmanamon, MD 07/07/13 [phone]

## 2013-07-07 NOTE — ED Notes (Signed)
Pt arrived via [facility] from [facility] c/o fall. Pt normally gets around in wheelchair. Dresser drawer was opened and pt lying on floor was found by staff. Staff states pt is at baseline neurologically. Hematoma noted on forehead above left eye.

## 2013-07-07 NOTE — ED Notes (Signed)
Skin tear right knee

## 2013-07-07 NOTE — ED Notes (Signed)
This RN attempted x2 to start an IV, pt extremely combative, pt is elderly and demented, unsuccessful at IV start, Wynell BalloonMorgan B. RN attempted x2 also unsuccessful. Sirisha EMT assisted with pt during these attempts. EDPA Leone PayorLackey, Conal Shetley RN, Mardella LaymanLindsey RN, Lequita HaltMorgan RN, and PenalosaSirisha EMT were all needed to collect blood, ua sample, and to perform in and out cath

## 2013-07-07 NOTE — Discharge Instructions (Signed)
Make a follow up appointment with your doctor in 2 days for reevaluation. There appears to be a subacute infarct on CT scan at today's visit, appears patient has had stroke sometime within the past 1-2 years but does not appear acute. Return to Emergency department if you develop any worsening symptoms, Headaches, fever/chills, or changes in mental status.

## 2013-07-07 NOTE — ED Provider Notes (Signed)
CSN: [id number]     Arrival date & time 07/07/13  4098 History   First MD Initiated Contact with Patient 07/07/13 [phone]     Chief Complaint  Patient presents with  . Fall     (Consider location/radiation/quality/duration/timing/severity/associated sxs/prior Treatment) Patient is a 80 y.o. female presenting with fall.  Fall   80 yo female presents from [facility] via EMS after patient was found on the floor by staff. Staff states patient is at her baseline neurologically. Staff states patient normally uses wheelchair to get around. States Dresser drawer was opened at scene and suspect patient stood up to get something out of her dresser.  Patient PMH significant for dementia, Alzheimer's disease, HTN, Dyslipidemia. Patient is level V caveat secondary to dementia.   Past Medical History  Diagnosis Date  . Hypertension   . Dementia in Alzheimer's disease   . Constipation   . Dyslipidemia    Past Surgical History  Procedure Laterality Date  . Dyslipidemia     History reviewed. No pertinent family history. History  Substance Use Topics  . Smoking status: Unknown If Ever Smoked  . Smokeless tobacco: Former Neurosurgeon  . Alcohol Use: No   OB History   Grav Para Term Preterm Abortions TAB SAB Ect Mult Living                 Review of Systems  Unable to perform ROS: Dementia      Allergies  Ace inhibitors  Home Medications    BP 158/78  Pulse 68  Temp(Src) 98 F (36.7 C) (Oral)  Resp 18  Ht 6\' 3"  (1.905 m)  Wt 145 lb (65.772 kg)  BMI 18.12 kg/m2  SpO2 98% Physical Exam  Nursing note and vitals reviewed. Constitutional: She appears well-developed and well-nourished. She is uncooperative. She is easily aroused. No distress.  HENT:  Head: Normocephalic. Head is with contusion. Head is without raccoon's eyes, without laceration, without right periorbital erythema and without left periorbital erythema.    Nose: Nose normal.  Eyes: Conjunctivae are normal.    Neck: Normal range of motion. Neck supple. No JVD present. No tracheal deviation present.  Cardiovascular: Normal rate, regular rhythm and normal heart sounds.  Exam reveals no gallop and no friction rub.   No murmur heard. Pulmonary/Chest: Effort normal. No respiratory distress. She has no wheezes. She has no rhonchi. She has no rales.  Abdominal: Soft. There is no tenderness.  Musculoskeletal: She exhibits no edema and no tenderness.       Right wrist: She exhibits swelling (with mild erythema. No warmth to area. ). She exhibits no bony tenderness.  Neurological: She is alert and easily aroused.  Patient alert but not oriented. Suspect this is patient's baseline according to hx from Staff at nursing facility.   Skin: Skin is warm and dry. She is not diaphoretic.  Psychiatric: She has a normal mood and affect. Her behavior is normal.    ED Course  Procedures (including critical care time) Labs Review Labs Reviewed  BASIC METABOLIC PANEL - Abnormal; Notable for the following:    GFR calc non Af Amer 76 (*)    GFR calc Af Amer 88 (*)    All other components within normal limits  CBC WITH DIFFERENTIAL - Abnormal; Notable for the following:    Hemoglobin 15.5 (*)    All other components within normal limits  URINALYSIS, ROUTINE W REFLEX MICROSCOPIC   Imaging Review Dg Wrist Complete Right  07/07/2013   CLINICAL  DATA:  Post fall, now with wrist pain and swelling  EXAM: RIGHT WRIST - COMPLETE 3+ VIEW  COMPARISON:  No comparisons  FINDINGS: The lateral radiograph is degraded due to obliquity.  No definite fracture or dislocation. Joint spaces are preserved. No evidence of chondrocalcinosis. There is mild diffuse soft tissue swelling about the wrist. Vascular calcifications. No radiopaque foreign body.  IMPRESSION: Degraded examination demonstrates apparent diffuse soft tissue swelling about the wrist but is without definitive associated displaced fracture.   Electronically Signed   By: Simonne ComeJohn   Watts M.D.   On: 07/07/2013 09:01   Ct Head Wo Contrast  07/07/2013   CLINICAL DATA:  Larey SeatFell, found down.  Left hematoma.  EXAM: CT HEAD WITHOUT CONTRAST  TECHNIQUE: Contiguous axial images were obtained from the base of the skull through the vertex without intravenous contrast.  COMPARISON:  10/30/2011  FINDINGS: Left frontal scalp hematoma. Subacute left frontal cortical nonhemorrhagic infarct, new since prior study. Diffuse parenchymal atrophy. Patchy areas of hypoattenuation in deep and periventricular white matter bilaterally. Negative for acute intracranial hemorrhage, mass lesion, midline shift, or mass-effect. Acute infarct may be inapparent on noncontrast CT. Ventricles and sulci symmetric. Bone windows demonstrate no focal lesion. Atherosclerotic and physiologic intracranial calcifications.  IMPRESSION: 1. Subacute nonhemorrhagic left frontal cortical infarct, new since prior exam. 2. Left frontal scalp hematoma. 3. Atrophy and nonspecific white matter changes as before.   Electronically Signed   By: Oley Balmaniel  Hassell M.D.   On: 07/07/2013 07:11     EKG Interpretation   Date/Time:  Sunday July 07 2013 07:41:36 EST Ventricular Rate:  73 PR Interval:  133 QRS Duration: 94 QT Interval:  531 QTC Calculation: 585 R Axis:   -59 Text Interpretation:  Sinus rhythm Ventricular premature complex LAD,  consider left anterior fascicular block Probable anterior infarct, age  indeterminate Confirmed by Bebe ShaggyWICKLINE  MD, DONALD ([id number]) on 07/07/2013  8:13:49 AM      MDM   Final diagnoses:  Fall at nursing home    UA not consistent with UTI. Normal Electrolytes  Plain films shows soft tissue swelling of Right Wrist without obvious fracture. Doubt cellulitis, given patient afebrile with normal VS and no Leukocytosis.  CT shows Subacute nonhemorrhagic left frontal cortical infarct, new since prior study.   No obvious signs of infection. Patient appears at baseline mental status. Spoke with nursing  home, who reports similar story of patient being found lying in floor. Nursing home states patient typically alert but is not oriented, and they deny noticing any recent mental status change in patient. Patient in NAD. Appears stable for discharge. Plan to have patient follow up with PCP in 2 days for further evaluation and management.       Rudene AndaJacob Gray Tearsa Kowalewski, PA-C 07/08/13 1059

## 2013-07-09 NOTE — ED Provider Notes (Signed)
Medical screening examination/treatment/procedure(s) were conducted as a shared visit with non-physician practitioner(s) and myself.  I personally evaluated the patient during the encounter.   EKG Interpretation   Date/Time:  Sunday July 07 2013 07:41:36 EST Ventricular Rate:  73 PR Interval:  133 QRS Duration: 94 QT Interval:  531 QTC Calculation: 585 R Axis:   -59 Text Interpretation:  Sinus rhythm Ventricular premature complex LAD,  consider left anterior fascicular block Probable anterior infarct, age  indeterminate Confirmed by Bebe ShaggyWICKLINE  MD, Taylr Meuth ([id number]) on 07/07/2013  8:13:49 AM        Joya Gaskinsonald W Jamiracle Avants, MD 07/09/13 [phone]

## 2016-01-13 ENCOUNTER — Encounter (HOSPITAL_COMMUNITY): Payer: Self-pay

## 2016-01-13 ENCOUNTER — Inpatient Hospital Stay (HOSPITAL_COMMUNITY)
Admission: EM | Admit: 2016-01-13 | Discharge: 2016-01-20 | DRG: 640 | Disposition: A | Discharge status: Skilled Nursing Facility | Payer: Medicare Other | Attending: Internal Medicine | Admitting: Internal Medicine

## 2016-01-13 ENCOUNTER — Emergency Department (HOSPITAL_COMMUNITY): Payer: Medicare Other

## 2016-01-13 DIAGNOSIS — E785 Hyperlipidemia, unspecified: Secondary | ICD-10-CM

## 2016-01-13 DIAGNOSIS — L89153 Pressure ulcer of sacral region, stage 3: Secondary | ICD-10-CM | POA: Diagnosis not present

## 2016-01-13 DIAGNOSIS — I10 Essential (primary) hypertension: Secondary | ICD-10-CM | POA: Diagnosis present

## 2016-01-13 DIAGNOSIS — G309 Alzheimer's disease, unspecified: Secondary | ICD-10-CM | POA: Diagnosis present

## 2016-01-13 DIAGNOSIS — Z23 Encounter for immunization: Secondary | ICD-10-CM

## 2016-01-13 DIAGNOSIS — G459 Transient cerebral ischemic attack, unspecified: Secondary | ICD-10-CM | POA: Insufficient documentation

## 2016-01-13 DIAGNOSIS — E43 Unspecified severe protein-calorie malnutrition: Secondary | ICD-10-CM | POA: Diagnosis present

## 2016-01-13 DIAGNOSIS — R6251 Failure to thrive (child): Secondary | ICD-10-CM | POA: Diagnosis present

## 2016-01-13 DIAGNOSIS — R1310 Dysphagia, unspecified: Secondary | ICD-10-CM | POA: Diagnosis present

## 2016-01-13 DIAGNOSIS — E870 Hyperosmolality and hypernatremia: Principal | ICD-10-CM

## 2016-01-13 DIAGNOSIS — Z8673 Personal history of transient ischemic attack (TIA), and cerebral infarction without residual deficits: Secondary | ICD-10-CM | POA: Diagnosis not present

## 2016-01-13 DIAGNOSIS — K5900 Constipation, unspecified: Secondary | ICD-10-CM | POA: Diagnosis not present

## 2016-01-13 DIAGNOSIS — R64 Cachexia: Secondary | ICD-10-CM | POA: Diagnosis present

## 2016-01-13 DIAGNOSIS — I5032 Chronic diastolic (congestive) heart failure: Secondary | ICD-10-CM

## 2016-01-13 DIAGNOSIS — E876 Hypokalemia: Secondary | ICD-10-CM | POA: Diagnosis not present

## 2016-01-13 DIAGNOSIS — Z4659 Encounter for fitting and adjustment of other gastrointestinal appliance and device: Secondary | ICD-10-CM

## 2016-01-13 DIAGNOSIS — F0280 Dementia in other diseases classified elsewhere without behavioral disturbance: Secondary | ICD-10-CM

## 2016-01-13 DIAGNOSIS — Z66 Do not resuscitate: Secondary | ICD-10-CM | POA: Diagnosis present

## 2016-01-13 DIAGNOSIS — E861 Hypovolemia: Secondary | ICD-10-CM | POA: Diagnosis present

## 2016-01-13 DIAGNOSIS — N183 Chronic kidney disease, stage 3 (moderate): Secondary | ICD-10-CM | POA: Diagnosis present

## 2016-01-13 DIAGNOSIS — N390 Urinary tract infection, site not specified: Secondary | ICD-10-CM | POA: Diagnosis present

## 2016-01-13 DIAGNOSIS — E878 Other disorders of electrolyte and fluid balance, not elsewhere classified: Secondary | ICD-10-CM | POA: Diagnosis present

## 2016-01-13 DIAGNOSIS — N19 Unspecified kidney failure: Secondary | ICD-10-CM

## 2016-01-13 DIAGNOSIS — I609 Nontraumatic subarachnoid hemorrhage, unspecified: Secondary | ICD-10-CM | POA: Insufficient documentation

## 2016-01-13 DIAGNOSIS — L89152 Pressure ulcer of sacral region, stage 2: Secondary | ICD-10-CM | POA: Diagnosis present

## 2016-01-13 DIAGNOSIS — N179 Acute kidney failure, unspecified: Secondary | ICD-10-CM

## 2016-01-13 DIAGNOSIS — K5641 Fecal impaction: Secondary | ICD-10-CM

## 2016-01-13 DIAGNOSIS — K5649 Other impaction of intestine: Secondary | ICD-10-CM | POA: Diagnosis present

## 2016-01-13 DIAGNOSIS — Z515 Encounter for palliative care: Secondary | ICD-10-CM | POA: Diagnosis not present

## 2016-01-13 DIAGNOSIS — E860 Dehydration: Secondary | ICD-10-CM | POA: Diagnosis present

## 2016-01-13 DIAGNOSIS — Z681 Body mass index (BMI) 19 or less, adult: Secondary | ICD-10-CM | POA: Diagnosis not present

## 2016-01-13 DIAGNOSIS — K5901 Slow transit constipation: Secondary | ICD-10-CM | POA: Diagnosis not present

## 2016-01-13 DIAGNOSIS — L8990 Pressure ulcer of unspecified site, unspecified stage: Secondary | ICD-10-CM | POA: Insufficient documentation

## 2016-01-13 DIAGNOSIS — I130 Hypertensive heart and chronic kidney disease with heart failure and stage 1 through stage 4 chronic kidney disease, or unspecified chronic kidney disease: Secondary | ICD-10-CM | POA: Diagnosis present

## 2016-01-13 DIAGNOSIS — B9620 Unspecified Escherichia coli [E. coli] as the cause of diseases classified elsewhere: Secondary | ICD-10-CM | POA: Diagnosis present

## 2016-01-13 DIAGNOSIS — R627 Adult failure to thrive: Secondary | ICD-10-CM | POA: Diagnosis present

## 2016-01-13 DIAGNOSIS — T85598A Other mechanical complication of other gastrointestinal prosthetic devices, implants and grafts, initial encounter: Secondary | ICD-10-CM

## 2016-01-13 HISTORY — DX: Chronic diastolic (congestive) heart failure: I50.32

## 2016-01-13 HISTORY — DX: Hyperlipidemia, unspecified: E78.5

## 2016-01-13 HISTORY — DX: Pseudobulbar affect: F48.2

## 2016-01-13 HISTORY — DX: Transient cerebral ischemic attack, unspecified: G45.9

## 2016-01-13 HISTORY — DX: Chronic kidney disease, stage 3 (moderate): N18.3

## 2016-01-13 HISTORY — DX: Chronic kidney disease, stage 3 unspecified: N18.30

## 2016-01-13 HISTORY — DX: Nontraumatic subarachnoid hemorrhage, unspecified: I60.9

## 2016-01-13 HISTORY — DX: Anxiety disorder, unspecified: F41.9

## 2016-01-13 LAB — I-STAT TROPONIN, ED: Troponin i, poc: 0.01 ng/mL (ref 0.00–0.08)

## 2016-01-13 LAB — CBC WITH DIFFERENTIAL/PLATELET
BASOS ABS: 0 10*3/uL (ref 0.0–0.1)
BASOS PCT: 0 %
Eosinophils Absolute: 0.1 10*3/uL (ref 0.0–0.7)
Eosinophils Relative: 1 %
HEMATOCRIT: 40.9 % (ref 36.0–46.0)
HEMOGLOBIN: 15.2 g/dL — AB (ref 12.0–15.0)
LYMPHS PCT: 22 %
Lymphs Abs: 2.1 10*3/uL (ref 0.7–4.0)
MCH: 39.7 pg — ABNORMAL HIGH (ref 26.0–34.0)
MCHC: 37.2 g/dL — ABNORMAL HIGH (ref 30.0–36.0)
MCV: 106.8 fL — ABNORMAL HIGH (ref 78.0–100.0)
MONOS PCT: 5 %
Monocytes Absolute: 0.4 10*3/uL (ref 0.1–1.0)
NEUTROS ABS: 6.8 10*3/uL (ref 1.7–7.7)
Neutrophils Relative %: 72 %
Platelets: 106 10*3/uL — ABNORMAL LOW (ref 150–400)
RBC: 3.83 MIL/uL — ABNORMAL LOW (ref 3.87–5.11)
RDW: 17.3 % — ABNORMAL HIGH (ref 11.5–15.5)
WBC: 9.4 10*3/uL (ref 4.0–10.5)

## 2016-01-13 LAB — URINALYSIS, ROUTINE W REFLEX MICROSCOPIC
Bilirubin Urine: NEGATIVE
GLUCOSE, UA: NEGATIVE mg/dL
Ketones, ur: NEGATIVE mg/dL
Nitrite: POSITIVE — AB
PH: 5.5 (ref 5.0–8.0)
Protein, ur: NEGATIVE mg/dL
Specific Gravity, Urine: 1.015 (ref 1.005–1.030)

## 2016-01-13 LAB — COMPREHENSIVE METABOLIC PANEL
ALBUMIN: 3.4 g/dL — AB (ref 3.5–5.0)
ALK PHOS: 91 U/L (ref 38–126)
ALT: 42 U/L (ref 14–54)
AST: 36 U/L (ref 15–41)
BUN: 141 mg/dL — AB (ref 6–20)
CALCIUM: 9.3 mg/dL (ref 8.9–10.3)
CO2: 31 mmol/L (ref 22–32)
CREATININE: 3.33 mg/dL — AB (ref 0.44–1.00)
Chloride: >130 mmol/L (ref 101–111)
GFR calc Af Amer: 13 mL/min — ABNORMAL LOW (ref >60)
GFR calc non Af Amer: 11 mL/min — ABNORMAL LOW (ref >60)
GLUCOSE: 159 mg/dL — AB (ref 65–99)
Potassium: 3.8 mmol/L (ref 3.5–5.1)
Sodium: 177 mmol/L (ref 135–145)
TOTAL PROTEIN: 7.4 g/dL (ref 6.5–8.1)
Total Bilirubin: 1.5 mg/dL — ABNORMAL HIGH (ref 0.3–1.2)

## 2016-01-13 LAB — CREATININE, URINE, RANDOM: Creatinine, Urine: 46.64 mg/dL

## 2016-01-13 LAB — URINE MICROSCOPIC-ADD ON

## 2016-01-13 LAB — OSMOLALITY, URINE: Osmolality, Ur: 499 mOsm/kg (ref 300–900)

## 2016-01-13 LAB — LIPASE, BLOOD: LIPASE: 357 U/L — AB (ref 11–51)

## 2016-01-13 LAB — OSMOLALITY: Osmolality: 427 mOsm/kg (ref 275–295)

## 2016-01-13 MED ORDER — CHLORHEXIDINE GLUCONATE 0.12 % MT SOLN
15.0000 mL | Freq: Two times a day (BID) | OROMUCOSAL | Status: DC
  Administered 2016-01-14 (×2): 15 mL via OROMUCOSAL
  Filled 2016-01-13: qty 15

## 2016-01-13 MED ORDER — DEXTROSE 5 % IV SOLN: INTRAVENOUS | Status: DC

## 2016-01-13 MED ORDER — ACETAMINOPHEN 650 MG RE SUPP: 650.0000 mg | Freq: Four times a day (QID) | RECTAL | Status: DC | PRN

## 2016-01-13 MED ORDER — SODIUM CHLORIDE 0.9% FLUSH
3.0000 mL | Freq: Two times a day (BID) | INTRAVENOUS | Status: DC
  Administered 2016-01-14 – 2016-01-19 (×10): 3 mL via INTRAVENOUS

## 2016-01-13 MED ORDER — MINERAL OIL RE ENEM
1.0000 | ENEMA | Freq: Once | RECTAL | Status: AC
  Administered 2016-01-13: 1 via RECTAL
  Filled 2016-01-13: qty 1

## 2016-01-13 MED ORDER — HEPARIN SODIUM (PORCINE) 5000 UNIT/ML IJ SOLN
5000.0000 [IU] | Freq: Three times a day (TID) | INTRAMUSCULAR | Status: DC
  Administered 2016-01-14 – 2016-01-19 (×18): 5000 [IU] via SUBCUTANEOUS
  Filled 2016-01-13 (×16): qty 1

## 2016-01-13 MED ORDER — DEXTROSE 5 % IV SOLN
1.0000 g | Freq: Every day | INTRAVENOUS | Status: DC
  Administered 2016-01-13 – 2016-01-17 (×5): 1 g via INTRAVENOUS
  Filled 2016-01-13 (×6): qty 10

## 2016-01-13 MED ORDER — HYDRALAZINE HCL 20 MG/ML IJ SOLN: 5.0000 mg | INTRAMUSCULAR | Status: DC | PRN

## 2016-01-13 MED ORDER — ONDANSETRON HCL 4 MG/2ML IJ SOLN: 4.0000 mg | Freq: Four times a day (QID) | INTRAMUSCULAR | Status: DC | PRN

## 2016-01-13 MED ORDER — ONDANSETRON HCL 4 MG PO TABS: 4.0000 mg | ORAL_TABLET | Freq: Four times a day (QID) | ORAL | Status: DC | PRN

## 2016-01-13 MED ORDER — DEXTROSE 5 % IV SOLN: 1.0000 g | Freq: Once | INTRAVENOUS | Status: DC

## 2016-01-13 MED ORDER — METOPROLOL TARTRATE 5 MG/5ML IV SOLN
2.5000 mg | Freq: Three times a day (TID) | INTRAVENOUS | Status: DC
  Filled 2016-01-13: qty 5

## 2016-01-13 MED ORDER — ORAL CARE MOUTH RINSE
15.0000 mL | Freq: Two times a day (BID) | OROMUCOSAL | Status: DC
  Administered 2016-01-14 (×2): 15 mL via OROMUCOSAL

## 2016-01-13 MED ORDER — ACETAMINOPHEN 325 MG PO TABS
650.0000 mg | ORAL_TABLET | Freq: Four times a day (QID) | ORAL | Status: DC | PRN
  Administered 2016-01-17 – 2016-01-18 (×2): 650 mg via ORAL
  Filled 2016-01-13 (×2): qty 2

## 2016-01-13 MED ORDER — DEXTROSE-NACL 5-0.45 % IV SOLN
INTRAVENOUS | Status: DC
  Administered 2016-01-13: 23:00:00 via INTRAVENOUS

## 2016-01-13 MED ORDER — LACTATED RINGERS IV SOLN
INTRAVENOUS | Status: DC
  Administered 2016-01-13: 20:00:00 via INTRAVENOUS

## 2016-01-13 MED ORDER — SODIUM CHLORIDE 0.9 % IV BOLUS (SEPSIS)
1000.0000 mL | Freq: Once | INTRAVENOUS | Status: AC
  Administered 2016-01-13: 1000 mL via INTRAVENOUS

## 2016-01-13 MED ORDER — DEXTROSE 5 % AND 0.45 % NACL IV BOLUS: 500.0000 mL | Freq: Once | INTRAVENOUS | Status: DC

## 2016-01-13 NOTE — ED Notes (Signed)
Mineral oil fleets enema administered at this time.  Prom and Amy, NT present during enema administration.  Pt placed on the bed pan following administration of enema.

## 2016-01-13 NOTE — ED Notes (Signed)
Pt returned from CT at this time. Pt in no apparent distress at this time.  Will continue to closely monitor pt.   

## 2016-01-13 NOTE — ED Notes (Signed)
Pt removed from bed pan at this time.  No results from fleets enema noted.  Only clear liquid in the bed pan at this time.  Prom and Amy, NT present during pt being removed from the bedpan.

## 2016-01-13 NOTE — ED Provider Notes (Addendum)
[facility]-EMERGENCY DEPT Provider Note   CSN: [id number] Arrival date & time: 01/13/16  1727     History   Chief Complaint Chief Complaint  Patient presents with  . Failure To Thrive    HPI Wanda Hughes is a 82 y.o. female.  HPI 82 year old female with past medical history of Alzheimer's dementia who presents with decreased by mouth intake. Per report from the nursing facility. The patient has had decreased by mouth intake over the last several days. She has not wanted to eat or drink anything. Her urine output is decreased. A KUB was obtained today, which is read as concerning for pneumoperitoneum. She socially transferred for evaluation. Nursing facility. Denies any fevers or chills. Per EMS, patient was hemodynamically stable en route. Remainder of history limited due to dementia so level V caveat imposed.  Past Medical History:  Diagnosis Date  . Anxiety   . Chronic diastolic (congestive) heart failure (HCC)   . CKD (chronic kidney disease), stage III   . Constipation   . Dementia in Alzheimer's disease   . Dyslipidemia   . Hyperlipemia   . Hypertension   . Pseudobulbar affect   . Subarachnoid hemorrhage, nontraumatic (HCC)   . TIA (transient ischemic attack)     Patient Active Problem List   Diagnosis Date Noted  . Hypernatremia 01/13/2016  . Acute renal failure superimposed on stage 3 chronic kidney disease (HCC) 01/13/2016  . Protein-calorie malnutrition, severe (HCC) 01/13/2016  . Hyperchloremia 01/13/2016  . Failure to thrive (0-17) 01/13/2016  . UTI (lower urinary tract infection) 01/13/2016  . TIA (transient ischemic attack)   . Subarachnoid hemorrhage, nontraumatic (HCC)   . Hypertension   . Hyperlipemia   . Dementia in Alzheimer's disease   . Constipation   . Chronic diastolic (congestive) heart failure [facility])     Past Surgical History:  Procedure Laterality Date  . dyslipidemia      OB History    No data available       Home Medications    Prior  to Admission medications   Medication Sig Start Date End Date Taking? Authorizing Provider  amLODipine (NORVASC) 5 MG tablet Take 5 mg by mouth every evening.    Yes Historical Provider, MD  cetirizine (ZYRTEC) 10 MG tablet Take 10 mg by mouth every evening.   Yes Historical Provider, MD  cloNIDine (CATAPRES) 0.1 MG tablet Take 0.1 mg by mouth daily. Hold for BP <160 systolic or <100 diastolic    Yes Historical Provider, MD  ezetimibe (ZETIA) 10 MG tablet Take 10 mg by mouth at bedtime.   Yes Historical Provider, MD  furosemide (LASIX) 40 MG tablet Take 40 mg by mouth 2 (two) times daily.   Yes Historical Provider, MD  labetalol (NORMODYNE) 300 MG tablet Take 300 mg by mouth 2 (two) times daily.   Yes Historical Provider, MD  latanoprost (XALATAN) 0.005 % ophthalmic solution Place 1 drop into both eyes at bedtime.   Yes Historical Provider, MD  LORazepam (ATIVAN) 2 MG/ML injection Inject 1 mg into the vein every 4 (four) hours as needed (agitation).   Yes Historical Provider, MD  Memantine HCl-Donepezil HCl (NAMZARIC) 28-10 MG CP24 Take 1 capsule by mouth every evening.   Yes Historical Provider, MD  polyethylene glycol (MIRALAX / GLYCOLAX) packet Take 17 g by mouth daily.   Yes Historical Provider, MD  polyvinyl alcohol (LIQUIFILM TEARS) 1.4 % ophthalmic solution Place 1 drop into both eyes daily as needed for dry eyes.  Yes Historical Provider, MD  potassium chloride SA (K-DUR,KLOR-CON) 20 MEQ tablet Take 20 mEq by mouth daily.   Yes Historical Provider, MD  senna (SENOKOT) 8.6 MG TABS tablet Take 2 tablets by mouth at bedtime.    Yes Historical Provider, MD  traZODone (DESYREL) 50 MG tablet Take 25 mg by mouth at bedtime. For depression   Yes Historical Provider, MD    Family History No family history on file.  Social History Social History  Substance Use Topics  . Smoking status: Unknown If Ever Smoked  . Smokeless tobacco: Former Neurosurgeon  . Alcohol use No     Allergies   Ace  inhibitors   Review of Systems Review of Systems  Unable to perform ROS: Dementia     Physical Exam Updated Vital Signs BP (!) 126/58 (BP Location: Right Arm)   Pulse (!) 52   Temp 97.5 F (36.4 C) (Oral)   Resp 14   Ht 5\' 4"  (1.626 m)   Wt 101 lb 13.6 oz (46.2 kg)   SpO2 100%   BMI 17.48 kg/m   Physical Exam  Constitutional: She appears well-developed and well-nourished. She has a sickly appearance. No distress.  HENT:  Head: Normocephalic and atraumatic.  Markedly dry mucous membranes  Eyes: Conjunctivae are normal.  Neck: Neck supple.  Cardiovascular: Normal rate, regular rhythm and normal heart sounds.  Exam reveals no friction rub.   No murmur heard. Pulmonary/Chest: Effort normal and breath sounds normal. No respiratory distress. She has no wheezes. She has no rales.  Abdominal: Soft. She exhibits no distension. There is tenderness (generalized). There is no rebound and no guarding.  Musculoskeletal: She exhibits no edema.  Neurological: She is alert. She exhibits normal muscle tone.  Moans but non-verbal. Withdraws to noxious stimuli in b/l UE and LE. MAE with at least antigravity strength. Face appears symmetric.  Skin: Skin is warm. Capillary refill takes 2 to 3 seconds.  Sacral decubitus ulcer  Nursing note and vitals reviewed.    ED Treatments / Results  Labs (all labs ordered are listed, but only abnormal results are displayed) Labs Reviewed  CBC WITH DIFFERENTIAL/PLATELET - Abnormal; Notable for the following:       Result Value   RBC 3.83 (*)    Hemoglobin 15.2 (*)    MCV 106.8 (*)    MCH 39.7 (*)    MCHC 37.2 (*)    RDW 17.3 (*)    Platelets 106 (*)    All other components within normal limits  COMPREHENSIVE METABOLIC PANEL - Abnormal; Notable for the following:    Sodium 177 (*)    Chloride >130 (*)    Glucose, Bld 159 (*)    BUN 141 (*)    Creatinine, Ser 3.33 (*)    Albumin 3.4 (*)    Total Bilirubin 1.5 (*)    GFR calc non Af Amer 11  (*)    GFR calc Af Amer 13 (*)    All other components within normal limits  LIPASE, BLOOD - Abnormal; Notable for the following:    Lipase 357 (*)    All other components within normal limits  URINALYSIS, ROUTINE W REFLEX MICROSCOPIC (NOT AT [facility]) - Abnormal; Notable for the following:    APPearance CLOUDY (*)    Hgb urine dipstick TRACE (*)    Nitrite POSITIVE (*)    Leukocytes, UA SMALL (*)    All other components within normal limits  OSMOLALITY - Abnormal; Notable for the following:  Osmolality 427 (*)    All other components within normal limits  URINE MICROSCOPIC-ADD ON - Abnormal; Notable for the following:    Squamous Epithelial / LPF 0-5 (*)    Bacteria, UA MANY (*)    All other components within normal limits  URINE CULTURE  CULTURE, BLOOD (ROUTINE X 2)  CULTURE, BLOOD (ROUTINE X 2)  MRSA PCR SCREENING  CREATININE, URINE, RANDOM  OSMOLALITY, URINE  UREA NITROGEN, URINE  CBC  BASIC METABOLIC PANEL  BASIC METABOLIC PANEL  BASIC METABOLIC PANEL  BASIC METABOLIC PANEL  I-STAT CHEM 8, ED  I-STAT TROPOININ, ED    EKG  EKG Interpretation  Date/Time:  Wednesday January 13 2016 18:33:19 EDT Ventricular Rate:  93 PR Interval:    QRS Duration: 124 QT Interval:  490 QTC Calculation: 452 R Axis:   -42 Text Interpretation:  Unable to assess due to artifact Confirmed by Zeynab Klett MD, Sheria Lang [phone]) on 01/13/2016 11:54:02 PM       Radiology Ct Abdomen Pelvis Wo Contrast  Result Date: 01/13/2016 CLINICAL DATA:  82 year old female with decreased appetite and rectal fecal impaction EXAM: CT ABDOMEN AND PELVIS WITHOUT CONTRAST TECHNIQUE: Multidetector CT imaging of the abdomen and pelvis was performed following the standard protocol without IV contrast. COMPARISON:  Acute abdominal series obtained earlier today ; CT scan of the abdomen and pelvis 07/23/2009 FINDINGS: Lower chest: Nonspecific patchy airspace opacity in the visualized left lower lobe may represent infiltrate  or atelectasis. Extensive coronary artery calcifications. The heart is normal in size. No pericardial effusion. Unremarkable visualized distal thoracic esophagus. Hepatobiliary: Normal hepatic contour and morphology. No discrete hepatic lesions. Normal appearance of the gallbladder. No intra or extrahepatic biliary ductal dilatation. Pancreas: No mass or inflammatory process identified on this un-enhanced exam. Spleen: Within normal limits in size. Adrenals/Urinary Tract: No evidence of urolithiasis or hydronephrosis. No definite mass visualized on this un-enhanced exam. Stomach/Bowel: Massive dilatation of the rectum which is impacted with a 10 x 12 x 13 cm stool ball. No evidence of a bowel obstruction. No evidence of free air. Vascular/Lymphatic: Extensive atherosclerotic vascular calcifications. Mild ectasia of the infrarenal abdominal aorta with a maximal diameter of 2.7 cm. No suspicious lymphadenopathy. Reproductive: Calcified degenerated uterine fibroids. Other: None Musculoskeletal: No acute fracture or aggressive appearing lytic or blastic osseous lesion. Advanced lower lumbar degenerative disc disease with levoconvex scoliosis and multilevel facet arthropathy. IMPRESSION: 1. As noted on the prior acute abdominal series, there is no evidence of free air. 2. Massively distended rectum impacted with a 10 x 12 x 13 cm stool ball. 3. Nonspecific patchy airspace opacity in the left lower lobe may represent pneumonia or atelectasis. 4. Extensive atherosclerotic vascular calcifications. 5.  Aortic Atherosclerosis (ICD10-170.0) 6. Advanced multilevel degenerative disc disease and lower lumbar facet arthropathy. Electronically Signed   By: Malachy Moan M.D.   On: 01/13/2016 20:19   Dg Abdomen Acute W/chest  Result Date: 01/13/2016 CLINICAL DATA:  Abdominal pain, possible bowel perforation EXAM: DG ABDOMEN ACUTE W/ 1V CHEST COMPARISON:  05/28/2010 FINDINGS: Cardiac shadow is stable. Aortic calcifications are  again noted. The lungs are well aerated bilaterally. Situation mediastinal markings is related to patient rotation. Scattered large and small bowel gas is noted. Changes consistent with rectal impaction are seen. No free air is noted. Degenerative changes of lumbar spine are seen. IMPRESSION: Changes consistent with rectal impaction. No definitive free intraperitoneal air is seen. Electronically Signed   By: Alcide Clever M.D.   On: 01/13/2016 19:13  Procedures Procedures (including critical care time)  Medications Ordered in ED Medications  hydrALAZINE (APRESOLINE) injection 5 mg (not administered)  metoprolol (LOPRESSOR) injection 2.5 mg (not administered)  heparin injection 5,000 Units (not administered)  sodium chloride flush (NS) 0.9 % injection 3 mL (not administered)  acetaminophen (TYLENOL) tablet 650 mg (not administered)    Or  acetaminophen (TYLENOL) suppository 650 mg (not administered)  ondansetron (ZOFRAN) tablet 4 mg (not administered)    Or  ondansetron (ZOFRAN) injection 4 mg (not administered)  dextrose 5 %-0.45 % sodium chloride infusion ( Intravenous New Bag/Given 01/13/16 2231)  cefTRIAXone (ROCEPHIN) 1 g in dextrose 5 % 50 mL IVPB (not administered)  chlorhexidine (PERIDEX) 0.12 % solution 15 mL (not administered)  MEDLINE mouth rinse (not administered)  sodium chloride 0.9 % bolus 1,000 mL (0 mLs Intravenous Stopped 01/13/16 1950)  mineral oil enema 1 enema (1 enema Rectal Given 01/13/16 2106)     Initial Impression / Assessment and Plan / ED Course  I have reviewed the triage vital signs and the nursing notes.  Pertinent labs & imaging results that were available during my care of the patient were reviewed by me and considered in my medical decision making (see chart for details).  Clinical Course    82 year old female with past medical history of dementia who presents with decreased by mouth intake, decreased urine output and concern for pneumoperitoneum. On  arrival, patient is afebrile and hemodynamically stable. On exam, she is chronically ill-appearing, and markedly dehydrated with delayed cap refill. Will give 1 L normal saline. Regarding her pneumoperitoneum, she has no rigidity or guarding, although this is limited due to her dementia. Will obtain stat abdominal series. I discussed the case with patient's son, [name], [place], at cell [phone], home: [phone], who is in agreement.  Labs and imaging as above. CBC with no leukocytosis. CMP reveals marked hypernatremia of 177, chloride greater than 1:30, and acute on chronic kidney injury with creatinine 3.33. BUN elevated at 141. Bicarbonate is normal and there is normal anion gap. This is consistent with likely profound hypovolemia from decreased by mouth intake. Otherwise, acute abdominal series shows no free air and this is confirmed on CT non-con. She has constipation for which I'll give an enema. Regarding her hypernatremia, I discussed the case with Dr. Levada SchillingSummers of ICU. Will place on LR, admit to step down.  Discussed case with Dr. Clyde LundborgNiu. I have also discussed with [facility]. [id number], continue supportive care. Unclear whether AMS is due to uremia, hypernatremia, or dementia but she is not too far off from baseline based on discussion with son. Will admit to Step Down.  Final Clinical Impressions(s) / ED Diagnoses   Final diagnoses:  Essential hypertension  Hyperlipemia  Dementia in Alzheimer's disease  Constipation, unspecified constipation type  Chronic diastolic (congestive) heart failure (HCC)  Hypernatremia  Uremia  AKI (acute kidney injury) [facility](HCC)    New Prescriptions Current Discharge Medication List       Shaune Pollackameron Gawain Crombie, MD 01/14/16 0004    Shaune Pollackameron Nicloe Frontera, MD 01/14/16 0004

## 2016-01-13 NOTE — ED Notes (Signed)
Patient transported to X-ray 

## 2016-01-13 NOTE — ED Notes (Signed)
Patient transported to CT at this time via ED stretcher. Pt in no apparent distress at this time.   

## 2016-01-13 NOTE — H&P (Addendum)
History and Physical    Maekayla P Bolla ZOX:[id number] DOB: 01/19/1925 DOA: 01/13/2016  ReferriBard Herbertng MD/NP/PA:   PCP: Eloisa NorthernAMIN, SAAD, MD   Patient coming from:  The patient is coming from SNF.  At baseline, pt is dependent for her ADL.  Chief Complaint: Failure to thrive and poor appetite  HPI: Wanda HerbertRose P Hughes is a 82 y.o. female with medical history significant of Alzheimer's disease, hypertension, hyperlipidemia, anxiety, TIA, SAH 2015, CKD-III, dCHF, who presents with failure to thrive and  poor appetite.  Patient has dementia and possible worsening mental status, is unable to provide any accurate medical history, therefore, most of the history is obtained by discussing the case with ED physician, per EMS report, and with the nursing staff. History is very limited.  It seems that pt has decreased oral intake and failure to thrive recently in SNF. X-ray of abd was done in SNF, which showed possible pneumoperitoneum today, therefore pt is transferred to ED for further evaluation and treatment. When I saw pt in ED, she is not oriented x 3 and can not answer any question. Patient has history of dementia, not sure what is the baseline mental status. Not sure if she has any pain anywhere. She moves all extremities upon painful stimuli. She is not actively coughing, does not seem to have nausea and vomiting. No diarrhea noted.  ED Course: pt was found to have sodium 177, chloride 1:30, potassium 3.8, bicarbonate 31, BUN 140, creatinine 3.33 (baseline creatinine 1.3), blood sugar by 59, lipase 357, troponin negative, WBC 9.4, hemoglobin 15.2, previously to 106, bradycardia, no tachypnea, oxygen saturation 94% on room air, pending urinalysis.   # X-ray of acute abdomen/chest showed rectal impaction without definitive free intraperitoneal air.  # CT-abdomen/pelvis showed massively distended rectum impacted with a 10 x 12 x 13 cm stool ball; nonspecific patchy airspace opacity in the left lower lobe, advanced  multilevel degenerative disc disease and lower lumbarfacet arthropathy, extensive atherosclerotic vascular calcifications, and aortic Atherosclerosis  Review of Systems: Could not be reviewed due to dementia.  Allergy:  Allergies  Allergen Reactions  . Ace Inhibitors Other (See Comments)    [facility]    Past Medical History:  Diagnosis Date  . Anxiety   . Chronic diastolic (congestive) heart failure (HCC)   . CKD (chronic kidney disease), stage III   . Constipation   . Dementia in Alzheimer's disease   . Dyslipidemia   . Hyperlipemia   . Hypertension   . Pseudobulbar affect   . Subarachnoid hemorrhage, nontraumatic (HCC)   . TIA (transient ischemic attack)     Past Surgical History:  Procedure Laterality Date  . dyslipidemia    I could not reviewed surgical history due to dementia  Social History:  has an unknown smoking status. She has quit using smokeless tobacco. She reports that she does not drink alcohol or use drugs.  Family History: Could not be reviewed due to dementia.  Prior to Admission medications   Medication Sig Start Date End Date Taking? Authorizing Provider  amLODipine (NORVASC) 5 MG tablet Take 5 mg by mouth every evening.    Yes Historical Provider, MD  cetirizine (ZYRTEC) 10 MG tablet Take 10 mg by mouth every evening.   Yes Historical Provider, MD  cloNIDine (CATAPRES) 0.1 MG tablet Take 0.1 mg by mouth daily. Hold for BP <160 systolic or <100 diastolic    Yes Historical Provider, MD  ezetimibe (ZETIA) 10 MG tablet Take 10 mg by mouth at  bedtime.   Yes Historical Provider, MD  furosemide (LASIX) 40 MG tablet Take 40 mg by mouth 2 (two) times daily.   Yes Historical Provider, MD  labetalol (NORMODYNE) 300 MG tablet Take 300 mg by mouth 2 (two) times daily.   Yes Historical Provider, MD  latanoprost (XALATAN) 0.005 % ophthalmic solution Place 1 drop into both eyes at bedtime.   Yes Historical Provider, MD  LORazepam (ATIVAN) 2 MG/ML  injection Inject 1 mg into the vein every 4 (four) hours as needed (agitation).   Yes Historical Provider, MD  Memantine HCl-Donepezil HCl (NAMZARIC) 28-10 MG CP24 Take 1 capsule by mouth every evening.   Yes Historical Provider, MD  polyethylene glycol (MIRALAX / GLYCOLAX) packet Take 17 g by mouth daily.   Yes Historical Provider, MD  polyvinyl alcohol (LIQUIFILM TEARS) 1.4 % ophthalmic solution Place 1 drop into both eyes daily as needed for dry eyes.   Yes Historical Provider, MD  potassium chloride SA (K-DUR,KLOR-CON) 20 MEQ tablet Take 20 mEq by mouth daily.   Yes Historical Provider, MD  senna (SENOKOT) 8.6 MG TABS tablet Take 2 tablets by mouth at bedtime.    Yes Historical Provider, MD  traZODone (DESYREL) 50 MG tablet Take 25 mg by mouth at bedtime. For depression   Yes Historical Provider, MD    Physical Exam: Vitals:   01/13/16 1734 01/13/16 1739 01/13/16 1907 01/13/16 1915  BP:  (!) 164/106 (!) 115/50 (!) 109/51  Pulse:  111  (!) 52  Resp:  18  11  SpO2: 95% 94%  96%   General: Not in acute distress. Cachectic looking, dry mucus membrane HEENT:       Eyes: PERRL, EOMI, no scleral icterus.       ENT: No discharge from the ears and nose, no pharynx injection, no tonsillar enlargement.        Neck: No JVD, no bruit, no mass felt. Heme: No neck lymph node enlargement. Cardiac: S1/S2, RRR, No murmurs, No gallops or rubs. Respiratory: No rales, wheezing, rhonchi or rubs. GI: Soft, nondistended, nontender, no organomegaly, BS present. GU: No hematuria Ext: No pitting leg edema bilaterally. 2+DP/PT pulse bilaterally. Musculoskeletal: No joint deformities, No joint redness or warmth, no limitation of ROM in spin. Skin: No rashes. No skin tear Neuro: Not oriented X3, cranial nerves II-XII grossly intact, moves all extremities upon painful stimuli. Psych: Could not be reviewed due to dementia.  Labs on Admission: I have personally reviewed following labs and imaging  studies  CBC:  Recent Labs Lab 01/13/16 1808  WBC 9.4  NEUTROABS 6.8  HGB 15.2*  HCT 40.9  MCV 106.8*  PLT 106*   Basic Metabolic Panel:  Recent Labs Lab 01/13/16 1808  NA 177*  K 3.8  CL >130*  CO2 31  GLUCOSE 159*  BUN 141*  CREATININE 3.33*  CALCIUM 9.3   GFR: CrCl cannot be calculated (Unknown ideal weight.). Liver Function Tests:  Recent Labs Lab 01/13/16 1808  AST 36  ALT 42  ALKPHOS 91  BILITOT 1.5*  PROT 7.4  ALBUMIN 3.4*    Recent Labs Lab 01/13/16 1808  LIPASE 357*   No results for input(s): AMMONIA in the last 168 hours. Coagulation Profile: No results for input(s): INR, PROTIME in the last 168 hours. Cardiac Enzymes: No results for input(s): CKTOTAL, CKMB, CKMBINDEX, TROPONINI in the last 168 hours. BNP (last 3 results) No results for input(s): PROBNP in the last 8760 hours. HbA1C: No results for input(s): HGBA1C in the  last 72 hours. CBG: No results for input(s): GLUCAP in the last 168 hours. Lipid Profile: No results for input(s): CHOL, HDL, LDLCALC, TRIG, CHOLHDL, LDLDIRECT in the last 72 hours. Thyroid Function Tests: No results for input(s): TSH, T4TOTAL, FREET4, T3FREE, THYROIDAB in the last 72 hours. Anemia Panel: No results for input(s): VITAMINB12, FOLATE, FERRITIN, TIBC, IRON, RETICCTPCT in the last 72 hours. Urine analysis:    Component Value Date/Time   COLORURINE YELLOW 07/07/2013 0904   APPEARANCEUR CLEAR 07/07/2013 0904   LABSPEC 1.019 07/07/2013 0904   PHURINE 7.0 07/07/2013 0904   GLUCOSEU NEGATIVE 07/07/2013 0904   HGBUR NEGATIVE 07/07/2013 0904   BILIRUBINUR NEGATIVE 07/07/2013 0904   KETONESUR NEGATIVE 07/07/2013 0904   PROTEINUR NEGATIVE 07/07/2013 0904   UROBILINOGEN 0.2 07/07/2013 0904   NITRITE NEGATIVE 07/07/2013 0904   LEUKOCYTESUR NEGATIVE 07/07/2013 0904   Sepsis Labs: @LABRCNTIP (procalcitonin:4,lacticidven:4) )No results found for this or any previous visit (from the past 240 hour(s)).    Radiological Exams on Admission: Ct Abdomen Pelvis Wo Contrast  Result Date: 01/13/2016 CLINICAL DATA:  82 year old female with decreased appetite and rectal fecal impaction EXAM: CT ABDOMEN AND PELVIS WITHOUT CONTRAST TECHNIQUE: Multidetector CT imaging of the abdomen and pelvis was performed following the standard protocol without IV contrast. COMPARISON:  Acute abdominal series obtained earlier today ; CT scan of the abdomen and pelvis 07/23/2009 FINDINGS: Lower chest: Nonspecific patchy airspace opacity in the visualized left lower lobe may represent infiltrate or atelectasis. Extensive coronary artery calcifications. The heart is normal in size. No pericardial effusion. Unremarkable visualized distal thoracic esophagus. Hepatobiliary: Normal hepatic contour and morphology. No discrete hepatic lesions. Normal appearance of the gallbladder. No intra or extrahepatic biliary ductal dilatation. Pancreas: No mass or inflammatory process identified on this un-enhanced exam. Spleen: Within normal limits in size. Adrenals/Urinary Tract: No evidence of urolithiasis or hydronephrosis. No definite mass visualized on this un-enhanced exam. Stomach/Bowel: Massive dilatation of the rectum which is impacted with a 10 x 12 x 13 cm stool ball. No evidence of a bowel obstruction. No evidence of free air. Vascular/Lymphatic: Extensive atherosclerotic vascular calcifications. Mild ectasia of the infrarenal abdominal aorta with a maximal diameter of 2.7 cm. No suspicious lymphadenopathy. Reproductive: Calcified degenerated uterine fibroids. Other: None Musculoskeletal: No acute fracture or aggressive appearing lytic or blastic osseous lesion. Advanced lower lumbar degenerative disc disease with levoconvex scoliosis and multilevel facet arthropathy. IMPRESSION: 1. As noted on the prior acute abdominal series, there is no evidence of free air. 2. Massively distended rectum impacted with a 10 x 12 x 13 cm stool ball. 3.  Nonspecific patchy airspace opacity in the left lower lobe may represent pneumonia or atelectasis. 4. Extensive atherosclerotic vascular calcifications. 5.  Aortic Atherosclerosis (ICD10-170.0) 6. Advanced multilevel degenerative disc disease and lower lumbar facet arthropathy. Electronically Signed   By: Malachy Moan M.D.   On: 01/13/2016 20:19   Dg Abdomen Acute W/chest  Result Date: 01/13/2016 CLINICAL DATA:  Abdominal pain, possible bowel perforation EXAM: DG ABDOMEN ACUTE W/ 1V CHEST COMPARISON:  05/28/2010 FINDINGS: Cardiac shadow is stable. Aortic calcifications are again noted. The lungs are well aerated bilaterally. Situation mediastinal markings is related to patient rotation. Scattered large and small bowel gas is noted. Changes consistent with rectal impaction are seen. No free air is noted. Degenerative changes of lumbar spine are seen. IMPRESSION: Changes consistent with rectal impaction. No definitive free intraperitoneal air is seen. Electronically Signed   By: Alcide Clever M.D.   On: 01/13/2016 19:13  EKG: Independently reviewed.  EKG is in poor quality, sinus rhythm, QTC 452, anteroseptal infarction pattern.  Assessment/Plan Principal Problem:   Hypernatremia Active Problems:   Hypertension   Hyperlipemia   Dementia in Alzheimer's disease   Constipation   Acute renal failure superimposed on stage 3 chronic kidney disease (HCC)   Protein-calorie malnutrition, severe (HCC)   Hyperchloremia   Failure to thrive (0-17)   Chronic diastolic (congestive) heart failure (HCC)  Hypernatremia,  Hyperchloremia and dehydration: Patient has hypovolemic hypernatremia. Sodium 177, chloride 130 with carbonate 31.  Not very sure how acute it is, but likely chronic (>48h) due to decreased oral free water intake. Pt received 1 L of normal saline bolus in the ED. PCCM was consuted by EDP, Dr. Arsenio Loader recommended IV fluids treatment. Renal was consulted (DEP dose not remember renal dr  name)-->Recommended D5-1/2NS and q4h BMP  -will admit to SDU -Frequent neuro check -D5-1/2NS at 100 cc/h, will correct her Na by ~0.4 mEq/h or ~10 mEq/24hour. -check BMP q4h -check urine and plasma Osmo and urine sodium level  Addendum: the repeated BMP ato 00:18 AM showed sodium level is up from 177 -->178. -switch D5-1/2 NS to D5 at 75 cc/h   Dementia: not sure about the baseline mental status. Currently pt is not oriented x 3, likely has worsening mental status. -Hold all oral medications until mental status improves -hold memantine  Constipation: -Hold oral medications -Mineral oil enema ordered by ED physician  Protein-calorie malnutrition, severe and failure to thrive: Patient has decreased oral intake likely due to constipation and dementia. -Consult to nutrition -treat constipation  HTN: bp 104/50 currently -hold oral meds: Amlodipine, clonidine, Lasix, labetalol -IV hydralazine when necessary -IV metoprolol 2.5 mg 3 times a day with holding parameters  HLD: Last LDL was not on recore -hole oral home medications: Zetia  AoCKD-III: Baseline Cre is 1.3, pt's Cre is 3.3d and BUN 141 on admission. Likely due to prerenal secondary to dehydration and continuation of diruetics - IVF as above - Check FeUrea - Follow up renal function by BMP - Hold Diuretics, lasix  Chronic dCHF: 2-D echo on 09/25/08 showed EF of 65-70% with grade 1 diastolic dysfunction. Patient is clinically dry. -Hold Lasix -Switch labetalol to IV metoprolol due to altered mental status.  Addendum:  UTI: UA is positive for UTI with small marginal leukocyte, positive nitrates.  -will start IV rocephin -f/u urine culture  DVT ppx: SQ Heparin  (Pt has worsening CKD-III, sq heparin is better choice than sq Lovenox)   Code Status: Full code Family Communication: None at bed side. Disposition Plan:  Anticipate discharge back to previous SNF environment Consults called:  PCCm, Dr. Arsenio Loader; renal will be  consulted by EDP Admission status: SDU/inpation       Date of Service 01/13/2016    Lorretta Harp [facility] Pager [phone]  If 7PM-7AM, please contact night-coverage [email or web address] Password [facility] 01/13/2016, 9:32 PM

## 2016-01-13 NOTE — ED Notes (Signed)
In and out catheterization performed at this time.  Prom and Amy NT's present during the procedure. Pt tolerated procedure without complaint at this time.

## 2016-01-13 NOTE — ED Triage Notes (Signed)
Per [facility]: Pt is from [facility]. Pt was dx with pneumoperitoneum today. Pt has had a decreased appetite. Facility wanted her transferred her to have this further evaluated.

## 2016-01-14 ENCOUNTER — Encounter (HOSPITAL_COMMUNITY): Payer: Self-pay

## 2016-01-14 ENCOUNTER — Inpatient Hospital Stay (HOSPITAL_COMMUNITY): Payer: Medicare Other

## 2016-01-14 DIAGNOSIS — N390 Urinary tract infection, site not specified: Secondary | ICD-10-CM

## 2016-01-14 DIAGNOSIS — E878 Other disorders of electrolyte and fluid balance, not elsewhere classified: Secondary | ICD-10-CM

## 2016-01-14 DIAGNOSIS — E785 Hyperlipidemia, unspecified: Secondary | ICD-10-CM

## 2016-01-14 DIAGNOSIS — L8990 Pressure ulcer of unspecified site, unspecified stage: Secondary | ICD-10-CM | POA: Insufficient documentation

## 2016-01-14 LAB — BASIC METABOLIC PANEL
BUN: 125 mg/dL — ABNORMAL HIGH (ref 6–20)
BUN: 128 mg/dL — AB (ref 6–20)
BUN: 129 mg/dL — AB (ref 6–20)
BUN: 134 mg/dL — AB (ref 6–20)
CALCIUM: 8.8 mg/dL — AB (ref 8.9–10.3)
CO2: 30 mmol/L (ref 22–32)
CO2: 30 mmol/L (ref 22–32)
CO2: 32 mmol/L (ref 22–32)
CO2: 34 mmol/L — AB (ref 22–32)
CREATININE: 2.85 mg/dL — AB (ref 0.44–1.00)
Calcium: 9.1 mg/dL (ref 8.9–10.3)
Calcium: 8.8 mg/dL — ABNORMAL LOW (ref 8.9–10.3)
Calcium: 8.8 mg/dL — ABNORMAL LOW (ref 8.9–10.3)
Chloride: >130 mmol/L (ref 101–111)
Creatinine, Ser: 3.09 mg/dL — ABNORMAL HIGH (ref 0.44–1.00)
Creatinine, Ser: 2.98 mg/dL — ABNORMAL HIGH (ref 0.44–1.00)
Creatinine, Ser: 2.97 mg/dL — ABNORMAL HIGH (ref 0.44–1.00)
GFR calc Af Amer: 14 mL/min — ABNORMAL LOW (ref >60)
GFR calc Af Amer: 15 mL/min — ABNORMAL LOW (ref >60)
GFR calc Af Amer: 15 mL/min — ABNORMAL LOW (ref >60)
GFR calc non Af Amer: 12 mL/min — ABNORMAL LOW (ref >60)
GFR calc non Af Amer: 13 mL/min — ABNORMAL LOW (ref >60)
GFR calc non Af Amer: 13 mL/min — ABNORMAL LOW (ref >60)
GFR, EST AFRICAN AMERICAN: 16 mL/min — AB (ref >60)
GFR, EST NON AFRICAN AMERICAN: 14 mL/min — AB (ref >60)
GLUCOSE: 188 mg/dL — AB (ref 65–99)
GLUCOSE: 218 mg/dL — AB (ref 65–99)
GLUCOSE: 225 mg/dL — AB (ref 65–99)
GLUCOSE: 208 mg/dL — AB (ref 65–99)
POTASSIUM: 3.5 mmol/L (ref 3.5–5.1)
POTASSIUM: 3.6 mmol/L (ref 3.5–5.1)
POTASSIUM: 3.7 mmol/L (ref 3.5–5.1)
Potassium: 3.5 mmol/L (ref 3.5–5.1)
Sodium: 178 mmol/L (ref 135–145)
Sodium: 176 mmol/L (ref 135–145)
Sodium: 175 mmol/L (ref 135–145)
Sodium: 176 mmol/L (ref 135–145)

## 2016-01-14 LAB — SODIUM
SODIUM: 177 mmol/L — AB (ref 135–145)
SODIUM: 172 mmol/L — AB (ref 135–145)
SODIUM: 170 mmol/L — AB (ref 135–145)
Sodium: 177 mmol/L (ref 135–145)
Sodium: 176 mmol/L (ref 135–145)
Sodium: 177 mmol/L (ref 135–145)
Sodium: 175 mmol/L (ref 135–145)

## 2016-01-14 LAB — MRSA PCR SCREENING: MRSA by PCR: NEGATIVE

## 2016-01-14 LAB — GLUCOSE, CAPILLARY
GLUCOSE-CAPILLARY: 156 mg/dL — AB (ref 65–99)
GLUCOSE-CAPILLARY: 151 mg/dL — AB (ref 65–99)
Glucose-Capillary: 193 mg/dL — ABNORMAL HIGH (ref 65–99)
Glucose-Capillary: 190 mg/dL — ABNORMAL HIGH (ref 65–99)
Glucose-Capillary: 136 mg/dL — ABNORMAL HIGH (ref 65–99)

## 2016-01-14 LAB — CBC
HCT: 47.9 % — ABNORMAL HIGH (ref 36.0–46.0)
Hemoglobin: 13.6 g/dL (ref 12.0–15.0)
MCH: 29.9 pg (ref 26.0–34.0)
MCHC: 28.4 g/dL — ABNORMAL LOW (ref 30.0–36.0)
MCV: 105.3 fL — AB (ref 78.0–100.0)
PLATELETS: 139 10*3/uL — AB (ref 150–400)
RBC: 4.55 MIL/uL (ref 3.87–5.11)
RDW: 17.1 % — ABNORMAL HIGH (ref 11.5–15.5)
WBC: 7.9 10*3/uL (ref 4.0–10.5)

## 2016-01-14 MED ORDER — DEXTROSE 5 % IV SOLN
INTRAVENOUS | Status: DC
  Administered 2016-01-14 – 2016-01-16 (×4): via INTRAVENOUS
  Administered 2016-01-16: 75 mL via INTRAVENOUS
  Administered 2016-01-17: 21:00:00 via INTRAVENOUS

## 2016-01-14 MED ORDER — COLLAGENASE 250 UNIT/GM EX OINT
TOPICAL_OINTMENT | Freq: Every day | CUTANEOUS | Status: DC
  Administered 2016-01-14 – 2016-01-17 (×4): via TOPICAL
  Administered 2016-01-18: 1 via TOPICAL
  Administered 2016-01-19 – 2016-01-20 (×2): via TOPICAL
  Filled 2016-01-14 (×2): qty 30

## 2016-01-14 MED ORDER — INSULIN ASPART 100 UNIT/ML ~~LOC~~ SOLN
0.0000 [IU] | SUBCUTANEOUS | Status: DC
  Administered 2016-01-14 (×3): 2 [IU] via SUBCUTANEOUS
  Administered 2016-01-14 – 2016-01-15 (×2): 1 [IU] via SUBCUTANEOUS
  Administered 2016-01-15: 2 [IU] via SUBCUTANEOUS
  Administered 2016-01-15: 1 [IU] via SUBCUTANEOUS
  Administered 2016-01-15: 2 [IU] via SUBCUTANEOUS
  Administered 2016-01-15: 1 [IU] via SUBCUTANEOUS
  Administered 2016-01-16: 3 [IU] via SUBCUTANEOUS
  Administered 2016-01-16: 2 [IU] via SUBCUTANEOUS
  Administered 2016-01-16: 3 [IU] via SUBCUTANEOUS
  Administered 2016-01-16: 2 [IU] via SUBCUTANEOUS
  Administered 2016-01-16: 3 [IU] via SUBCUTANEOUS
  Administered 2016-01-17 (×2): 1 [IU] via SUBCUTANEOUS
  Administered 2016-01-17: 5 [IU] via SUBCUTANEOUS
  Administered 2016-01-17 (×2): 2 [IU] via SUBCUTANEOUS
  Administered 2016-01-18 – 2016-01-20 (×4): 1 [IU] via SUBCUTANEOUS

## 2016-01-14 MED ORDER — ORAL CARE MOUTH RINSE
15.0000 mL | Freq: Two times a day (BID) | OROMUCOSAL | Status: DC
  Administered 2016-01-15 – 2016-01-20 (×11): 15 mL via OROMUCOSAL

## 2016-01-14 MED ORDER — CHLORHEXIDINE GLUCONATE 0.12 % MT SOLN
15.0000 mL | Freq: Two times a day (BID) | OROMUCOSAL | Status: DC
  Administered 2016-01-14 – 2016-01-20 (×11): 15 mL via OROMUCOSAL
  Filled 2016-01-14 (×10): qty 15

## 2016-01-14 MED ORDER — FREE WATER
150.0000 mL | Freq: Every day | Status: DC
  Administered 2016-01-14 – 2016-01-17 (×14): 150 mL

## 2016-01-14 MED ORDER — FREE WATER
100.0000 mL | Freq: Every day | Status: DC
  Administered 2016-01-14: 100 mL

## 2016-01-14 NOTE — Significant Event (Signed)
RN contacted patient's son [name] to let him know that his mother is admitted to the hospital and she is in the unit. RN updated patient's son. He stated all the siblings is aware their mother is in the hospital.

## 2016-01-14 NOTE — Significant Event (Signed)
Sodium=175 Cloride=> 130  Received these critical valve this morning and reported them to Dr. Joseph ArtWoods. Per MD, RN can place Cortrak feeding tube at this time.

## 2016-01-14 NOTE — Progress Notes (Signed)
CRITICAL VALUE ALERT  Critical value received:  Sodium 178.  Chloride > 130, BUN 134, creatinine 3.09  Date of notification:  01/14/2016  Time of notification:  1:10 AM  Critical value read back:Yes.    MD notified (1st page):  NP Schorr paged at [phone]

## 2016-01-14 NOTE — Progress Notes (Signed)
CRITICAL VALUE ALERT  Critical value received:  Sodium 176, chloride >130.   Date of notification:  01/14/2016  Time of notification:  0537  Critical value read back: yes   NP Schorr paged at 0630 to inform of morning lab values including BUN 129, Cr 2.98, Glucose 225.

## 2016-01-14 NOTE — Progress Notes (Signed)
PT Cancellation Note and D/C  Patient Details Name: Wanda Hughes MRN: [id number] DOB: [birth year]   Cancelled Treatment:    Reason Eval/Treat Not Completed: PT screened, no needs identified, will sign off ([facility], pt total care for b/d, feeding as well as mechanical lift transfers to geri chair. )No PT needs will sign off.  Thanks.   Tawni MillersWhite, Traci Gafford F 01/14/2016, 11:51 AM Eber Jonesawn Jenevieve Kirschbaum,PT Acute Rehabilitation [phone] (pager)

## 2016-01-14 NOTE — Progress Notes (Signed)
CRITICAL VALUE ALERT  Critical value received:  Sodium 177  Date of notification: 01/14/2016  Time of notification:  0330  Critical value read back:Yes.    Nurse who received alert:  Nelly LaurenceNatalie Ballard Budney  MD notified (1st page): Dr. Joseph ArtWoods

## 2016-01-14 NOTE — Progress Notes (Signed)
Initial Nutrition Assessment  DOCUMENTATION CODES:   Underweight, Severe malnutrition in context of chronic illness  INTERVENTION:   -RD will follow for diet advancement -If pt remains unable to take PO, recommend:  Initiate Jevity 1.2 @ 20 ml/hr via cortrak and increase by 10 ml every 12 hours to goal rate of 50 ml/hr.   Tube feeding regimen provides 1440 kcal (100% of needs), 67 grams of protein, and 968 ml of H2O.   -If TF is initiated, recommend monitoring K, Mg, and Phos daily x 3 days, as pt is at risk for refeeding syndrome   NUTRITION DIAGNOSIS:   Malnutrition related to chronic illness as evidenced by severe depletion of body fat, severe depletion of muscle mass.  GOAL:   Patient will meet greater than or equal to 90% of their needs  MONITOR:   Diet advancement, Labs, Weight trends, Skin, I & O's  REASON FOR ASSESSMENT:   Low Braden, Consult Assessment of nutrition requirement/status  ASSESSMENT:   Wanda Hughes is a 82 y.o. female with medical history significant of Alzheimer's disease, hypertension, hyperlipidemia, anxiety, TIA, SAH 2015, CKD-III, dCHF, who presents with failure to thrive and  poor appetite.  Pt admitted with hypernatremia and failure to thrive.   Pt unable to provide hx due to cognitive deficit. No family present at time of exam. Reviewed records from SNF; noted pt was on a pureed diet with nectar thick liquids PTA. Unsure if pt was receiving oral nutrition supplements PTA.   Reviewed CWOCN note from 01/14/16; pt with 3 pressure injuries to sacrum (stage II, stage III and unstageable per doc flowsheets). Pt with increased nutrient needs for wound healing. Currently on air mattress.   No recent wt hx available, however, suspect ongoing wt loss. Per chart review, pt also was experiencing poor oral intake PTA.   Nutrition-Focused physical exam completed. Findings are severe fat depletion, severe muscle depletion, and no edema.   Cortrak tube  placed on 01/14/16. Tip of tube verified past the duodenum per KUB results. KUB also noted fecal impaction.  Case dicussed with RN, who confirmed cortrak tube use for medications. RN reports MD has been paged regarding TF and will round on pt again later today. RD will provide TF recommendations.   Labs reviewed: Na: 177, CBGS: 190.    Diet Order:  Diet NPO time specified  Skin:  Wound (see comment) (3 pressure injuries on sacrum (UN, st II, st III))  Last BM:  01/14/16  Height:   Ht Readings from Last 1 Encounters:  01/13/16 5\' 4"  (1.626 m)    Weight:   Wt Readings from Last 1 Encounters:  01/13/16 101 lb 13.6 oz (46.2 kg)    Ideal Body Weight:  54.5 kg  BMI:  Body mass index is 17.48 kg/m.  Estimated Nutritional Needs:   Kcal:  1400-1600  Protein:  65-80 grams  Fluid:  >1.4 L  EDUCATION NEEDS:   No education needs identified at this time  Jaymari Cromie A. Mayford KnifeWilliams, RD, LDN, CDE Pager: [phone] After hours Pager: [phone]

## 2016-01-14 NOTE — Significant Event (Signed)
Patient transitioned into a air mattress overly bed to help with skin integrity as patient has ulcers at admission.

## 2016-01-14 NOTE — Care Management Note (Signed)
Case Management Note  Patient Details  Name: Bard HerbertRose P Madding MRN: [id number] Date of Birth: 1924-07-05  Subjective/Objective:   Adm w hypernatremia                 Action/Plan:from nsg facility, sw ref placed   Expected Discharge Date:                  Expected Discharge Plan:  Skilled Nursing Facility  In-House Referral:  Clinical Social Work  Discharge planning Services     Post Acute Care Choice:    Choice offered to:     DME Arranged:    DME Agency:     HH Arranged:    HH Agency:     Status of Service:  In process, will continue to follow  If discussed at Long Length of Stay Meetings, dates discussed:    Additional Comments:  Hanley HaysDowell, Javonnie Illescas T, RN 01/14/2016, 9:56 AM

## 2016-01-14 NOTE — Progress Notes (Signed)
CRITICAL VALUE ALERT  Critical value received:   Na+ 175  Date of notification: 01/14/2016  Time of notification:  1900  Critical value read back:Yes.    Nurse who received alert:  Nelly LaurenceNatalie Anaisa Radi  MD notified (1st page):  Craige CottaKirby, NP  Time of first page:  [phone]

## 2016-01-14 NOTE — Progress Notes (Signed)
PROGRESS NOTE    Wanda Hughes  ZOX:[id number] DOB: May 23, 1924 DOA: 01/13/2016 PCP: Eloisa Northern, MD   Brief Narrative:   82 y.o. BF PMHx Alzheimer's disease, Anxiety,TIA, SAH 2015, HTN, HLD, hypertension, hyperlipidemia,  CKD-III, Chronic Diastolic CHF,   who presents with failure to thrive and  poor appetite.  Patient has dementia and possible worsening mental status, is unable to provide any accurate medical history, therefore, most of the history is obtained by discussing the case with ED physician, per EMS report, and with the nursing staff. History is very limited.  It seems that pt has decreased oral intake and failure to thrive recently in SNF. X-ray of abd was done in SNF, which showed possible pneumoperitoneum today, therefore pt is transferred to ED for further evaluation and treatment. When I saw pt in ED, she is not oriented x 3 and can not answer any question. Patient has history of dementia, not sure what is the baseline mental status. Not sure if she has any pain anywhere. She moves all extremities upon painful stimuli. She is not actively coughing, does not seem to have nausea and vomiting. No diarrhea noted.  ED Course: pt was found to have sodium 177, chloride 1:30, potassium 3.8, bicarbonate 31, BUN 140, creatinine 3.33 (baseline creatinine 1.3), blood sugar by 59, lipase 357, troponin negative, WBC 9.4, hemoglobin 15.2, previously to 106, bradycardia, no tachypnea, oxygen saturation 94% on room air, pending urinalysis.    Subjective: 9/7 cachectic unresponsive   Assessment & Plan:   Principal Problem:   Hypernatremia Active Problems:   Essential hypertension   Hyperlipemia   Dementia in Alzheimer's disease   Constipation   Acute renal failure superimposed on stage 3 chronic kidney disease (HCC)   Protein-calorie malnutrition, severe (HCC)   Hyperchloremia   Failure to thrive (0-17)   Chronic diastolic (congestive) heart failure (HCC)   UTI (lower urinary tract  infection)   Pressure ulcer   Hypernatremia,  Hyperchloremia and dehydration: Patient has hypovolemic hypernatremia. Sodium 177, chloride 130 with carbonate 31.  Not very sure how acute it is, but likely chronic (>48h) due to decreased oral free water intake. Pt received 1 L of normal saline bolus in the ED. PCCM was consuted by EDP, Dr. Arsenio Loader recommended IV fluids treatment. Renal was consulted (DEP dose not remember renal dr name)-->Recommended D5-1/2NS and q4h BMP  -Frequent neuro check -D5 at 24ml/hr + free water via NG tube 6x per day. -Do NOT CORRECT more than ~10 mEq/24hour. -Check NA and CBG q 2hr -check urine and plasma Osmo and urine sodium level pending  Dementia:  -not sure about the baseline mental status. Currently pt is obtunded -Hold all oral medications until mental status improves -hold memantine  Constipation: -Most likely secondary to SEVERE dehydration -Hold oral medications -Mineral oil enema ordered by ED physician -9/7 soapsuds enema Soapsuds enema with manual disimpaction. KUB on 9/8 to check for relief of impaction  Protein-calorie malnutrition, severe and failure to thrive:  -Patient has decreased oral intake likely due to constipation and dementia. -Consult to nutrition -treat constipation  HTN:  -bp 104/50 currently -hold oral meds: Amlodipine, clonidine, Lasix, labetalol -Hold all BP medication except Hydralazine PRN: Patient hypotensive for a 82 year old. Reassess in the A.m.  HLD: - Last LDL was not on recore -hole oral home medications: Zetia  Acute on CKD-III: (Baseline Cre is 1.3) Lab Results  Component Value Date   CREATININE 2.85 (H) 01/14/2016   CREATININE 2.97 (H) 01/14/2016  CREATININE 2.98 (H) 01/14/2016  -Admission Cr is 3.3d and BUN 141 on admission. Likely due to prerenal secondary to dehydration and continuation of diruetics - IVF as above - Check FeUrea - Follow up renal function by BMP - Hold Diuretics,  lasix  Chronic Diastolic CHF:  -2-D echo on 09/25/08 showed EF of 65-70% with grade 1 diastolic dysfunction. Patient is clinically dry. -Hold Lasix -Switch labetalol to IV metoprolol due to altered mental status.  UTI:  -UA is positive for UTI with small marginal leukocyte, positive nitrates.  -will start IV Rocephin -f/u urine culture  Sacral Decubitus Ulcer -  Place Foley to keep area dry and clean -Air mattress ensure on rotation at all times -Ensure daily changing of pads  Goals of care -Palliative Care consult placed. Very cachectic frail 82 year old. Address CODE STATUS, short-term vs long-term goals of care   DVT prophylaxis: Subcutaneous heparin Code Status: Full Family Communication: None Disposition Plan: ? Await palliative care recommendation   Consultants:  None   Procedures/Significant Events:  9/6 CT abdomen pelvis WO contrast:-Massively distended rectum impacted with a 10 x 12 x 13 cm stool ball. -Nonspecific patchy airspace opacity LLL;represent pneumonia or atelectasis. 9/7 DG abdomen:-Feeding tube noted with tip projected over the duodenum. -Findings again noted consistent with severe rectal fecal impaction . Distended loops of small and large bowel   Cultures 9/6 urine pending 9/6 MRSA by PCR negative 9/6 blood pending    Antimicrobials: Ceftriaxone 9/6>>   Devices    LINES / TUBES:      Continuous Infusions: . dextrose 75 mL/hr at 01/14/16 1707     Objective: Vitals:   01/14/16 1603 01/14/16 1700 01/14/16 1800 01/14/16 1900  BP: (!) 115/58 (!) 143/57 133/61   Pulse: (!) 53 (!) 58 64 (!) 57  Resp: 18 17 (!) 21 16  Temp: 97.4 F (36.3 C)     TempSrc: Oral     SpO2: 100% 95% 96% 100%  Weight:      Height:        Intake/Output Summary (Last 24 hours) at 01/14/16 2035 Last data filed at 01/14/16 1900  Gross per 24 hour  Intake          1771.66 ml  Output              300 ml  Net          1471.66 ml   Filed Weights    01/13/16 2338  Weight: 46.2 kg (101 lb 13.6 oz)    Examination:  General: Obtunded, cachectic No acute respiratory distress Eyes: negative scleral hemorrhage, negative anisocoria, negative icterus ENT: Negative Runny nose, negative gingival bleeding, Neck:  Negative scars, masses, torticollis, lymphadenopathy, JVD Lungs: Clear to auscultation bilaterally without wheezes or crackles Cardiovascular: Regular rate and rhythm without murmur gallop or rub normal S1 and S2 Abdomen: negative abdominal pain, nondistended, positive soft, bowel sounds, no rebound, no ascites, no appreciable mass Extremities: No significant cyanosis, clubbing, or edema bilateral lower extremities Skin: Negative rashes, lesions, ulcers Psychiatric:  Unable to assess Central nervous system:  Unable to assess  .     Data Reviewed: Care during the described time interval was provided by me .  I have reviewed this patient's available data, including medical history, events of note, physical examination, and all test results as part of my evaluation. I have personally reviewed and interpreted all radiology studies.  CBC:  Recent Labs Lab 01/13/16 1808 01/14/16 0419  WBC 9.4 7.9  NEUTROABS  6.8  --   HGB 15.2* 13.6  HCT 40.9 47.9*  MCV 106.8* 105.3*  PLT 106* 139*   Basic Metabolic Panel:  Recent Labs Lab 01/13/16 1808 01/14/16 0018 01/14/16 0419 01/14/16 0701 01/14/16 1107 01/14/16 1423 01/14/16 1546 01/14/16 1804  NA 177* 178* 176* 175* 177*  177*  176* 176* 177* 175*  K 3.8 3.6 3.5 3.7 3.5  --   --   --   CL >130* >130* >130* >130* >130*  --   --   --   CO2 31 34* 32 30 30  --   --   --   GLUCOSE 159* 188* 225* 218* 208*  --   --   --   BUN 141* 134* 129* 128* 125*  --   --   --   CREATININE 3.33* 3.09* 2.98* 2.97* 2.85*  --   --   --   CALCIUM 9.3 9.1 8.8* 8.8* 8.8*  --   --   --    GFR: Estimated Creatinine Clearance: 9.8 mL/min (by C-G formula based on SCr of 2.85 mg/dL). Liver  Function Tests:  Recent Labs Lab 01/13/16 1808  AST 36  ALT 42  ALKPHOS 91  BILITOT 1.5*  PROT 7.4  ALBUMIN 3.4*    Recent Labs Lab 01/13/16 1808  LIPASE 357*   No results for input(s): AMMONIA in the last 168 hours. Coagulation Profile: No results for input(s): INR, PROTIME in the last 168 hours. Cardiac Enzymes: No results for input(s): CKTOTAL, CKMB, CKMBINDEX, TROPONINI in the last 168 hours. BNP (last 3 results) No results for input(s): PROBNP in the last 8760 hours. HbA1C: No results for input(s): HGBA1C in the last 72 hours. CBG:  Recent Labs Lab 01/14/16 0805 01/14/16 1128 01/14/16 1601 01/14/16 2029  GLUCAP 193* 190* 156* 136*   Lipid Profile: No results for input(s): CHOL, HDL, LDLCALC, TRIG, CHOLHDL, LDLDIRECT in the last 72 hours. Thyroid Function Tests: No results for input(s): TSH, T4TOTAL, FREET4, T3FREE, THYROIDAB in the last 72 hours. Anemia Panel: No results for input(s): VITAMINB12, FOLATE, FERRITIN, TIBC, IRON, RETICCTPCT in the last 72 hours. Urine analysis:    Component Value Date/Time   COLORURINE YELLOW 01/13/2016 2129   APPEARANCEUR CLOUDY (A) 01/13/2016 2129   LABSPEC 1.015 01/13/2016 2129   PHURINE 5.5 01/13/2016 2129   GLUCOSEU NEGATIVE 01/13/2016 2129   HGBUR TRACE (A) 01/13/2016 2129   BILIRUBINUR NEGATIVE 01/13/2016 2129   KETONESUR NEGATIVE 01/13/2016 2129   PROTEINUR NEGATIVE 01/13/2016 2129   UROBILINOGEN 0.2 07/07/2013 0904   NITRITE POSITIVE (A) 01/13/2016 2129   LEUKOCYTESUR SMALL (A) 01/13/2016 2129   Sepsis Labs: @LABRCNTIP (procalcitonin:4,lacticidven:4)  ) Recent Results (from the past 240 hour(s))  MRSA PCR Screening     Status: None   Collection Time: 01/13/16 11:26 PM  Result Value Ref Range Status   MRSA by PCR NEGATIVE NEGATIVE Final    Comment:        The GeneXpert MRSA Assay (FDA approved for NASAL specimens only), is one component of a comprehensive MRSA colonization surveillance program. It is  not intended to diagnose MRSA infection nor to guide or monitor treatment for MRSA infections.          Radiology Studies: Ct Abdomen Pelvis Wo Contrast  Result Date: 01/13/2016 CLINICAL DATA:  82 year old female with decreased appetite and rectal fecal impaction EXAM: CT ABDOMEN AND PELVIS WITHOUT CONTRAST TECHNIQUE: Multidetector CT imaging of the abdomen and pelvis was performed following the standard protocol without IV contrast. COMPARISON:  Acute abdominal series obtained earlier today ; CT scan of the abdomen and pelvis 07/23/2009 FINDINGS: Lower chest: Nonspecific patchy airspace opacity in the visualized left lower lobe may represent infiltrate or atelectasis. Extensive coronary artery calcifications. The heart is normal in size. No pericardial effusion. Unremarkable visualized distal thoracic esophagus. Hepatobiliary: Normal hepatic contour and morphology. No discrete hepatic lesions. Normal appearance of the gallbladder. No intra or extrahepatic biliary ductal dilatation. Pancreas: No mass or inflammatory process identified on this un-enhanced exam. Spleen: Within normal limits in size. Adrenals/Urinary Tract: No evidence of urolithiasis or hydronephrosis. No definite mass visualized on this un-enhanced exam. Stomach/Bowel: Massive dilatation of the rectum which is impacted with a 10 x 12 x 13 cm stool ball. No evidence of a bowel obstruction. No evidence of free air. Vascular/Lymphatic: Extensive atherosclerotic vascular calcifications. Mild ectasia of the infrarenal abdominal aorta with a maximal diameter of 2.7 cm. No suspicious lymphadenopathy. Reproductive: Calcified degenerated uterine fibroids. Other: None Musculoskeletal: No acute fracture or aggressive appearing lytic or blastic osseous lesion. Advanced lower lumbar degenerative disc disease with levoconvex scoliosis and multilevel facet arthropathy. IMPRESSION: 1. As noted on the prior acute abdominal series, there is no evidence  of free air. 2. Massively distended rectum impacted with a 10 x 12 x 13 cm stool ball. 3. Nonspecific patchy airspace opacity in the left lower lobe may represent pneumonia or atelectasis. 4. Extensive atherosclerotic vascular calcifications. 5.  Aortic Atherosclerosis (ICD10-170.0) 6. Advanced multilevel degenerative disc disease and lower lumbar facet arthropathy. Electronically Signed   By: Malachy Moan M.D.   On: 01/13/2016 20:19   Dg Abd 1 View  Result Date: 01/14/2016 CLINICAL DATA:  Feeding tube placement. EXAM: ABDOMEN - 1 VIEW COMPARISON:  CT 01/13/2016. FINDINGS: Feeding tube noted with tip projected over the duodenum. Again noted is prominent distention of the rectum with stool. Fecal impaction most likely present. Distended loops of small and large bowel noted. Interposition of colon under the hemidiaphragm again noted. Degenerative changes lumbar spine and both hips. IMPRESSION: 1. Feeding tube noted with tip projected over the duodenum. 2. Findings again noted consistent with severe rectal fecal impaction . Distended loops of small and large bowel noted. Electronically Signed   By: Maisie Fus  Register   On: 01/14/2016 09:14   Dg Abdomen Acute W/chest  Result Date: 01/13/2016 CLINICAL DATA:  Abdominal pain, possible bowel perforation EXAM: DG ABDOMEN ACUTE W/ 1V CHEST COMPARISON:  05/28/2010 FINDINGS: Cardiac shadow is stable. Aortic calcifications are again noted. The lungs are well aerated bilaterally. Situation mediastinal markings is related to patient rotation. Scattered large and small bowel gas is noted. Changes consistent with rectal impaction are seen. No free air is noted. Degenerative changes of lumbar spine are seen. IMPRESSION: Changes consistent with rectal impaction. No definitive free intraperitoneal air is seen. Electronically Signed   By: Alcide Clever M.D.   On: 01/13/2016 19:13        Scheduled Meds: . cefTRIAXone (ROCEPHIN)  IV  1 g Intravenous QHS  . chlorhexidine   15 mL Mouth Rinse BID  . collagenase   Topical Daily  . free water  150 mL Per Tube 6 X Daily  . heparin  5,000 Units Subcutaneous Q8H  . insulin aspart  0-9 Units Subcutaneous Q4H  . mouth rinse  15 mL Mouth Rinse q12n4p  . sodium chloride flush  3 mL Intravenous Q12H   Continuous Infusions: . dextrose 75 mL/hr at 01/14/16 1707     LOS: 1 day  Time spent: 40 minutes    Skarleth Delmonico, Roselind Messier, MD [facility] Pager [phone]   If 7PM-7AM, please contact night-coverage [email or web address] Password [facility] 01/14/2016, 8:35 PM

## 2016-01-14 NOTE — Consult Note (Signed)
WOC Nurse wound consult note Reason for Consult: 3 pressure ulcers on sacrum Wound type:  Combination of pressure and moisture associated skin damage from urinary and fecal incontinence Pressure Ulcer POA: Yes Measurement: Entire area measures 5.3 cm x 5.7 cm without depth measurement due to necrotic areas Wound bed: 50% pink, 50% slough Drainage small amount on foam dressing Periwound: very fragile skin that is peeling and slightly darker in tone than skin on remainder of body. Dressing procedure/placement/frequency:  Santyl and foam dressing changes will be ordered.  Discussed POC with patient and bedside nurse.  Re consult if needed, will not follow at this time. Thanks Helmut MusterSherry Wetona Viramontes MSN, RN, CNS-BC, [employer]OCN

## 2016-01-14 NOTE — Progress Notes (Signed)
CRITICAL VALUE ALERT  Critical value received:  NA 172  Date of notification:  01/14/2016  Time of notification:  0858  Critical value read back:Yes.    Nurse who received alert:  Owens LofflerKaziah, Miller RN  MD notified (1st page):  Kirtland BouchardK. Schorr   Time of first page:  [phone]  *Trending down. MD aware

## 2016-01-14 NOTE — Progress Notes (Signed)
OT Cancellation Note  Patient Details Name: Bard HerbertRose P Diver MRN: [id number] DOB: [birth year]   Cancelled Treatment:    Reason Eval/Treat Not Completed: OT screened, no needs identified, will sign off.  Pt was total A for all ADLs and mobility.    Corinthian Mizrahi [place], OTR/L [phone]   Jeani HawkingConarpe, Darold Miley M 01/14/2016, 11:53 AM

## 2016-01-14 NOTE — Significant Event (Addendum)
Cortrak feeding tube placed for fluids, medications, nutrition uses as it is not safe for patient to take anything by mouth. Patient is still not following commands, moans when stimulated.   Cortrak feeding tube placed via left nare, marked at 80cm. No complications during placement. KUB ordered and obtained. Awaiting confirmation.

## 2016-01-14 NOTE — Progress Notes (Signed)
CRITICAL VALUE ALERT  Critical value received:  NA 170  Date of notification: 9 /11/2015  Time of notification:  2230  Critical value read back:Yes.    Nurse who received alert:  Owens LofflerKaziah Miller, RN

## 2016-01-15 ENCOUNTER — Inpatient Hospital Stay (HOSPITAL_COMMUNITY): Payer: Medicare Other

## 2016-01-15 DIAGNOSIS — Z515 Encounter for palliative care: Secondary | ICD-10-CM

## 2016-01-15 LAB — MISC LABCORP TEST (SEND OUT): Labcorp test code: 46557

## 2016-01-15 LAB — GLUCOSE, CAPILLARY
GLUCOSE-CAPILLARY: 142 mg/dL — AB (ref 65–99)
GLUCOSE-CAPILLARY: 179 mg/dL — AB (ref 65–99)
GLUCOSE-CAPILLARY: 169 mg/dL — AB (ref 65–99)
Glucose-Capillary: 145 mg/dL — ABNORMAL HIGH (ref 65–99)
Glucose-Capillary: 189 mg/dL — ABNORMAL HIGH (ref 65–99)
Glucose-Capillary: 123 mg/dL — ABNORMAL HIGH (ref 65–99)
Glucose-Capillary: 131 mg/dL — ABNORMAL HIGH (ref 65–99)

## 2016-01-15 LAB — COMPREHENSIVE METABOLIC PANEL
ALK PHOS: 80 U/L (ref 38–126)
ALT: 48 U/L (ref 14–54)
AST: 70 U/L — AB (ref 15–41)
Albumin: 2.8 g/dL — ABNORMAL LOW (ref 3.5–5.0)
Anion gap: 8 (ref 5–15)
BILIRUBIN TOTAL: 2.4 mg/dL — AB (ref 0.3–1.2)
BUN: 87 mg/dL — AB (ref 6–20)
CALCIUM: 8.3 mg/dL — AB (ref 8.9–10.3)
CHLORIDE: 129 mmol/L — AB (ref 101–111)
CO2: 27 mmol/L (ref 22–32)
CREATININE: 2.1 mg/dL — AB (ref 0.44–1.00)
GFR calc Af Amer: 23 mL/min — ABNORMAL LOW (ref >60)
GFR, EST NON AFRICAN AMERICAN: 20 mL/min — AB (ref >60)
Glucose, Bld: 104 mg/dL — ABNORMAL HIGH (ref 65–99)
Potassium: 4.7 mmol/L (ref 3.5–5.1)
Sodium: 164 mmol/L (ref 135–145)
Total Protein: 6.1 g/dL — ABNORMAL LOW (ref 6.5–8.1)

## 2016-01-15 LAB — SODIUM
SODIUM: 163 mmol/L — AB (ref 135–145)
SODIUM: 171 mmol/L — AB (ref 135–145)
SODIUM: 172 mmol/L — AB (ref 135–145)
Sodium: 173 mmol/L (ref 135–145)
Sodium: 170 mmol/L (ref 135–145)
Sodium: 161 mmol/L (ref 135–145)

## 2016-01-15 LAB — UREA NITROGEN, URINE: UREA NITROGEN UR: 705 mg/dL

## 2016-01-15 MED ORDER — BISACODYL 10 MG RE SUPP
10.0000 mg | Freq: Once | RECTAL | Status: AC
  Administered 2016-01-15: 10 mg via RECTAL
  Filled 2016-01-15: qty 1

## 2016-01-15 MED ORDER — JEVITY 1.2 CAL PO LIQD
1000.0000 mL | ORAL | Status: DC
  Administered 2016-01-15: 20 mL/h
  Administered 2016-01-17: 50 mL
  Filled 2016-01-15 (×4): qty 1000

## 2016-01-15 NOTE — Significant Event (Signed)
Soap suds enema performed, along with manual disimpaction of stools. RN removed as much stools as able to reach. Disimpacted stools is soften and medium amount removed. Patient tolerated procedure.

## 2016-01-15 NOTE — Progress Notes (Addendum)
Consult received for goals of care discussion. Called patient's son [name]. Mr[place] [name] and [place] in [place] middle of [place],. He is  of boarding up has had house and was unable to talk in depth at this time. We did agree that I would call back about 5:30 and see how he was doing and if he was in a place where he could talk about his mother's care. Eduard RouxSarah Kynleigh Artz, ANP-ACHPN  1730: Called back and got voice mail. Updated Dr. Allena KatzPatel . Will continue and try to reach son.

## 2016-01-15 NOTE — Clinical Social Work Note (Signed)
Clinical Social Work Assessment  Patient Details  Name: Wanda Hughes MRN: [id number] Date of Birth: 04-Jun-1924  Date of referral:  01/15/16               Reason for consult:  Facility Placement                Permission sought to share information with:  Facility Medical sales representativeContact Representative, Family Supports Permission granted to share information::  No (Pt is nonverbal)  Name::     Designer, fashion/clothinglewis  Agency::  [facility]  Relationship::  Son  Contact Information:  [phone]  Housing/Transportation Living arrangements for the past 2 months:  Skilled Nursing Facility Source of Information:  Adult Children Patient Interpreter Needed:  None Criminal Activity/Legal Involvement Pertinent to Current Situation/Hospitalization:  No - Comment as needed Significant Relationships:  Adult Children Lives with:  Facility Resident Do you feel safe going back to the place where you live?  Yes Need for family participation in patient care:  Yes (Comment)  Care giving concerns:  CSW received consult regarding discharge planning. CSW is disoriented. CSW spoke with patient's son. [facility] The plan is to return there at discharge. CSW to continue to follow for needs.    Social Worker assessment / plan:  [facility].   Employment status:  Retired Database administratornsurance information:  Managed Medicare PT Recommendations:  No Follow Up Information / Referral to community resources:  Skilled Nursing Facility  Patient/Family's Response to care:  Patient's son expressed understanding of discharge process and hopes patient can return to SNF soon.  Patient/Family's Understanding of and Emotional Response to Diagnosis, Current Treatment, and Prognosis:  No questions/concerns at this time.   Emotional Assessment Appearance:  Appears stated age Attitude/Demeanor/Rapport:  Unable to Assess Affect (typically observed):  Unable to  Assess Orientation:   (Disoriented) Alcohol / Substance use:  Not Applicable Psych involvement (Current and /or in the community):  No (Comment)  Discharge Needs  Concerns to be addressed:  Care Coordination Readmission within the last 30 days:  No Current discharge risk:  None Barriers to Discharge:  Continued Medical Work up   [employer] S Carnesha Maravilla, LCSWA 01/15/2016, 3:13 PM

## 2016-01-15 NOTE — Progress Notes (Signed)
CRITICAL VALUE ALERT  Critical value received:  Sodium  Date of notification:  01/15/16  Time of notification:  0130  Critical value read back:Yes.    Nurse who received alert:  Garrel Ridgel. Saharah Sherrow RN  MD notified (1st page):  K. Schorr  Time of first page:  [phone]

## 2016-01-15 NOTE — Significant Event (Signed)
  Critical values noted of elevated sodium. Staff and medical team monitoring it and treatment in progress. Dr. Allena KatzPatel aware of critical values received by nursing staff.     Wanda Hughes

## 2016-01-15 NOTE — [place] FL2 (Signed)
[place] MEDICAID FL2 LEVEL OF CARE SCREENING TOOL     IDENTIFICATION  Patient Name: Wanda Hughes Birthdate: May 07, 1925 Sex: female Admission Date (Current Location): 01/13/2016  [facility] and [place] Number:  Producer, television/film/video and Address:  [facility], [address], [place], [place] [address]      Provider Number: [id number]  Attending Physician Name and Address:  Rolly Salter, MD  Relative Name and Phone Number:  Melvyn Neth, son, [phone]    Current Level of Care: Hospital Recommended Level of Care: Skilled Nursing Facility Prior Approval Number:    Date Approved/Denied:   PASRR Number:    Discharge Plan: SNF    Current Diagnoses: Patient Active Problem List   Diagnosis Date Noted  . Pressure ulcer 01/14/2016  . Hypernatremia 01/13/2016  . Acute renal failure superimposed on stage 3 chronic kidney disease (HCC) 01/13/2016  . Protein-calorie malnutrition, severe (HCC) 01/13/2016  . Hyperchloremia 01/13/2016  . Failure to thrive (0-17) 01/13/2016  . UTI (lower urinary tract infection) 01/13/2016  . TIA (transient ischemic attack)   . Subarachnoid hemorrhage, nontraumatic (HCC)   . Essential hypertension   . Hyperlipemia   . Dementia in Alzheimer's disease   . Constipation   . Chronic diastolic (congestive) heart failure (HCC)     Orientation RESPIRATION BLADDER Height & Weight      (Disoriented)  Normal Incontinent, Indwelling catheter (Urinary catheter) Weight: 46.2 kg (101 lb 13.6 oz) Height:  5\' 4"  (162.6 cm)  BEHAVIORAL SYMPTOMS/MOOD NEUROLOGICAL BOWEL NUTRITION STATUS      Incontinent Feeding tube  AMBULATORY STATUS COMMUNICATION OF NEEDS Skin   Total Care Non-Verbally PU Stage and Appropriate Care (PU unstageable, Stage II, and Stage III on Sacrum)                       Personal Care Assistance Level of Assistance  Bathing, Feeding, Dressing, Total care Bathing Assistance: Maximum assistance Feeding assistance:  Maximum assistance Dressing Assistance: Maximum assistance Total Care Assistance: Maximum assistance   Functional Limitations Info  Speech     Speech Info: Impaired (Non verbal)    SPECIAL CARE FACTORS FREQUENCY                       Contractures      Additional Factors Info  Code Status, Allergies, Insulin Sliding Scale Code Status Info: Full Allergies Info: Ace Inhibitors   Insulin Sliding Scale Info: insulin aspart (novoLOG) injection 0-9 Units       Current Medications (01/15/2016):  This is the current hospital active medication list Current Facility-Administered Medications  Medication Dose Route Frequency Provider Last Rate Last Dose  . acetaminophen (TYLENOL) tablet 650 mg  650 mg Oral Q6H PRN Lorretta Harp, MD       Or  . acetaminophen (TYLENOL) suppository 650 mg  650 mg Rectal Q6H PRN Lorretta Harp, MD      . cefTRIAXone (ROCEPHIN) 1 g in dextrose 5 % 50 mL IVPB  1 g Intravenous QHS Lorretta Harp, MD   1 g at 01/14/16 2331  . chlorhexidine (PERIDEX) 0.12 % solution 15 mL  15 mL Mouth Rinse BID Drema Dallas, MD   15 mL at 01/15/16 0758  . collagenase (SANTYL) ointment   Topical Daily Drema Dallas, MD      . dextrose 5 % solution   Intravenous Continuous Rolly Salter, MD 100 mL/hr at 01/15/16 1033    .  feeding supplement (JEVITY 1.2 CAL) liquid 1,000 mL  1,000 mL Per Tube Continuous Rolly SalterPranav M Patel, MD 20 mL/hr at 01/15/16 1214 20 mL/hr at 01/15/16 1214  . free water 150 mL  150 mL Per Tube 6 X Daily Drema Dallasurtis J Woods, MD   150 mL at 01/15/16 1513  . heparin injection 5,000 Units  5,000 Units Subcutaneous Q8H Lorretta HarpXilin Niu, MD   5,000 Units at 01/15/16 1319  . hydrALAZINE (APRESOLINE) injection 5 mg  5 mg Intravenous Q2H PRN Lorretta HarpXilin Niu, MD      . insulin aspart (novoLOG) injection 0-9 Units  0-9 Units Subcutaneous Q4H Drema Dallasurtis J Woods, MD   1 Units at 01/15/16 1258  . MEDLINE mouth rinse  15 mL Mouth Rinse q12n4p Drema Dallasurtis J Woods, MD   15 mL at 01/15/16 1518  . ondansetron  (ZOFRAN) tablet 4 mg  4 mg Oral Q6H PRN Lorretta HarpXilin Niu, MD       Or  . ondansetron [facility](ZOFRAN) injection 4 mg  4 mg Intravenous Q6H PRN Lorretta HarpXilin Niu, MD      . sodium chloride flush (NS) 0.9 % injection 3 mL  3 mL Intravenous Q12H Lorretta HarpXilin Niu, MD   3 mL at 01/15/16 [id number]     Discharge Medications: Please see discharge summary for a list of discharge medications.  Relevant Imaging Results:  Relevant Lab Results:   Additional Information SSN: [id number]  Kaniel Kiang S [place], [place]

## 2016-01-15 NOTE — Progress Notes (Signed)
[facility] Progress Note  Patient: Wanda Hughes ZOX:[id number]   PCP: Eloisa NorthernAMIN, SAAD, MD DOB: 1925-04-05   DOA: 01/13/2016   DOS: 01/15/2016   Date of Service: the patient was seen and examined on 01/15/2016  Subjective: Awake but incoherent and speech. Denies any acute complaint. No acute events overnight. Nutrition: Remains nothing by mouth.  Brief hospital course: Pt. with PMH of dementia; admitted on 01/13/2016, with complaint of confusion, was found to have hypernatremia. Currently further plan is gradual correction of the sodium and resolution of fecal impaction.  Assessment and Plan: 1. Hypernatremia Continue with D5 drip. Gradual correction of the sodium 12 mEq per 24 hour. Monitor neurochecks for next 24 hours.  2. Fecal impaction. Soapsuds enema. Once impaction is resolved we can initiate stool softener.  3. Dysphagia. Speech therapy following. High likelihood of aspiration events in future. Palliative care consulted.  4. Protein calorie malnutrition. Severe. Dietary consulted. Will need to initiate tube feeding at present monitor daily magnesium and phosphorous for refeeding syndrome.  4. AKI. Serum creatinine elevated on admission. Improving. Continue monitoring.  5. UTI. Continue IV ceftriaxone.  6 sacral decubitus ulcer. Currently has a Foley catheter and mattress. Monitor.  Pain management: When necessary Tylenol Activity: Consulted physical therapy Bowel regimen: last BM 01/15/2016 Diet: On tube feeding, currently nothing by mouth DVT Prophylaxis: subcutaneous Heparin  Advance goals of care discussion: Full code, palliative care consulted  Family Communication: no family was present at bedside, at the time of interview.  Disposition:  Discharge to SNF. Expected discharge date: 01/19/2016,   Consultants: None Procedures: None  Antibiotics: Anti-infectives    Start     Dose/Rate Route Frequency Ordered Stop   01/13/16 2330  cefTRIAXone  (ROCEPHIN) 1 g in dextrose 5 % 50 mL IVPB     1 g 100 mL/hr over 30 Minutes Intravenous Daily at bedtime 01/13/16 2313     01/13/16 2330  cefTRIAXone (ROCEPHIN) 1 g in dextrose 5 % 50 mL IVPB  Status:  Discontinued     1 g 100 mL/hr over 30 Minutes Intravenous  Once 01/13/16 2317 01/13/16 2317        Intake/Output Summary (Last 24 hours) at 01/15/16 2040 Last data filed at 01/15/16 1800  Gross per 24 hour  Intake          2675.33 ml  Output              785 ml  Net          1890.33 ml   Filed Weights   01/13/16 2338  Weight: 46.2 kg (101 lb 13.6 oz)    Objective: Physical Exam: Vitals:   01/15/16 1600 01/15/16 1700 01/15/16 1800 01/15/16 2019  BP: 128/60 (!) 114/99 103/73 (!) 142/60  Pulse: 64 65 (!) 121 61  Resp: 20 16 16 20   Temp: 99.7 F (37.6 C)   99.1 F (37.3 C)  TempSrc: Oral   Axillary  SpO2: 100% 99% 91% 100%  Weight:      Height:        General: Alert, Awake and Oriented to person. Appear in mild distress Eyes: PERRL, Conjunctiva normal ENT: Oral Mucosa clear dry. Neck: no JVD, no Abnormal Mass Or lumps Cardiovascular: S1 and S2 Present, no Murmur, Respiratory: Bilateral Air entry equal and Decreased, Clear to Auscultation, no Crackles, no wheezes Abdomen: Bowel Sound present, Soft and no tenderness Skin: no redness, no Rash  Extremities: no Pedal edema, no calf tenderness Neurologic: Grossly no focal neuro  deficit. Bilaterally Equal motor strength  Data Reviewed: CBC:  Recent Labs Lab 01/13/16 1808 01/14/16 0419  WBC 9.4 7.9  NEUTROABS 6.8  --   HGB 15.2* 13.6  HCT 40.9 47.9*  MCV 106.8* 105.3*  PLT 106* 139*   Basic Metabolic Panel:  Recent Labs Lab 01/14/16 0018 01/14/16 0419 01/14/16 0701 01/14/16 1107  01/15/16 0206 01/15/16 0448 01/15/16 0705 01/15/16 1237 01/15/16 1716  NA 178* 176* 175* 177*  177*  176*  < > 172* 173* 170* 164* 163*  K 3.6 3.5 3.7 3.5  --   --   --   --  4.7  --   CL >130* >130* >130* >130*  --   --    --   --  129*  --   CO2 34* 32 30 30  --   --   --   --  27  --   GLUCOSE 188* 225* 218* 208*  --   --   --   --  104*  --   BUN 134* 129* 128* 125*  --   --   --   --  87*  --   CREATININE 3.09* 2.98* 2.97* 2.85*  --   --   --   --  2.10*  --   CALCIUM 9.1 8.8* 8.8* 8.8*  --   --   --   --  8.3*  --   < > = values in this interval not displayed.  Liver Function Tests:  Recent Labs Lab 01/13/16 1808 01/15/16 1237  AST 36 70*  ALT 42 48  ALKPHOS 91 80  BILITOT 1.5* 2.4*  PROT 7.4 6.1*  ALBUMIN 3.4* 2.8*    Recent Labs Lab 01/13/16 1808  LIPASE 357*   No results for input(s): AMMONIA in the last 168 hours. Coagulation Profile: No results for input(s): INR, PROTIME in the last 168 hours. Cardiac Enzymes: No results for input(s): CKTOTAL, CKMB, CKMBINDEX, TROPONINI in the last 168 hours. BNP (last 3 results) No results for input(s): PROBNP in the last 8760 hours.  CBG:  Recent Labs Lab 01/15/16 0407 01/15/16 0519 01/15/16 0757 01/15/16 1228 01/15/16 1621  GLUCAP 145* 179* 189* 123* 169*    Studies: Dg Abd Portable 1v  Result Date: 01/15/2016 CLINICAL DATA:  Constipation. EXAM: PORTABLE ABDOMEN - 1 VIEW COMPARISON:  01/14/2016. FINDINGS: Large fecal impaction in the rectum is redemonstrated. Enteric tube tip remains within the proximal duodenum. Moderate stool burden elsewhere in the colon appears stable. IMPRESSION: Stable abdominal radiograph.  Fecal impaction. Electronically Signed   By: Elsie Stain M.D.   On: 01/15/2016 08:02     Scheduled Meds: . cefTRIAXone (ROCEPHIN)  IV  1 g Intravenous QHS  . chlorhexidine  15 mL Mouth Rinse BID  . collagenase   Topical Daily  . free water  150 mL Per Tube 6 X Daily  . heparin  5,000 Units Subcutaneous Q8H  . insulin aspart  0-9 Units Subcutaneous Q4H  . mouth rinse  15 mL Mouth Rinse q12n4p  . sodium chloride flush  3 mL Intravenous Q12H   Continuous Infusions: . dextrose 75 mL/hr at 01/15/16 1759  . feeding  supplement (JEVITY 1.2 CAL) 20 mL/hr (01/15/16 1214)   PRN Meds: acetaminophen **OR** acetaminophen, hydrALAZINE, ondansetron **OR** ondansetron (ZOFRAN) IV  Time spent: 30 minutes  Author: Lynden Oxford, MD [facility] Pager: [phone] 01/15/2016 8:40 PM  If 7PM-7AM, please contact night-coverage at [email or web address], password [facility]

## 2016-01-15 NOTE — Progress Notes (Signed)
Nutrition Follow-up  DOCUMENTATION CODES:   Underweight, Severe malnutrition in context of chronic illness  INTERVENTION:   Initiate Jevity 1.2 @ 20 ml/hr via cortrak and increase by 10 ml every 12 hours to goal rate of 50 ml/hr.   Tube feeding regimen provides 1440 kcal (100% of needs), 67 grams of protein, and 968 ml of H2O.   -Recommend monitoring K, Mg, and Phos daily x 3 days, as pt is at risk for refeeding syndrome   NUTRITION DIAGNOSIS:   Malnutrition related to chronic illness as evidenced by severe depletion of body fat, severe depletion of muscle mass.  Ongoing  GOAL:   Patient will meet greater than or equal to 90% of their needs  Progressing  MONITOR:   Diet advancement, Labs, Weight trends, TF tolerance, Skin, I & O's  REASON FOR ASSESSMENT:   Consult Enteral/tube feeding initiation and management  ASSESSMENT:   Wanda Hughes is a 82 y.o. female with medical history significant of Alzheimer's disease, hypertension, hyperlipidemia, anxiety, TIA, SAH 2015, CKD-III, dCHF, who presents with failure to thrive and  poor appetite.  RD received consult for TF initiation and management.   Case discussed with RN. Pt was evaluated  By this RD on 01/14/16; refer to initial assessment dated 01/14/16 for further details. Per RN, received permission from MD to start TF. Pt is at risk for refeeding syndrome given malnutrition. RN agrees with slow advancement of TF.   Labs reviewed: CBGS: 179-189.  Diet Order:  Diet NPO time specified  Skin:  Wound (see comment) (3 pressure injuries on sacrum (UN, st II, st III))  Last BM:  01/14/16  Height:   Ht Readings from Last 1 Encounters:  01/13/16 5\' 4"  (1.626 m)    Weight:   Wt Readings from Last 1 Encounters:  01/13/16 101 lb 13.6 oz (46.2 kg)    Ideal Body Weight:  54.5 kg  BMI:  Body mass index is 17.48 kg/m.  Estimated Nutritional Needs:   Kcal:  1400-1600  Protein:  65-80 grams  Fluid:  >1.4 L  EDUCATION  NEEDS:   No education needs identified at this time  Alric Geise A. Mayford KnifeWilliams, RD, LDN, CDE Pager: [phone] After hours Pager: [phone]

## 2016-01-16 ENCOUNTER — Inpatient Hospital Stay (HOSPITAL_COMMUNITY): Payer: Medicare Other

## 2016-01-16 LAB — BASIC METABOLIC PANEL
ANION GAP: 11 (ref 5–15)
BUN: 57 mg/dL — ABNORMAL HIGH (ref 6–20)
CHLORIDE: 119 mmol/L — AB (ref 101–111)
CO2: 27 mmol/L (ref 22–32)
Calcium: 8.2 mg/dL — ABNORMAL LOW (ref 8.9–10.3)
Creatinine, Ser: 1.69 mg/dL — ABNORMAL HIGH (ref 0.44–1.00)
GFR calc Af Amer: 30 mL/min — ABNORMAL LOW (ref >60)
GFR calc non Af Amer: 26 mL/min — ABNORMAL LOW (ref >60)
Glucose, Bld: 205 mg/dL — ABNORMAL HIGH (ref 65–99)
POTASSIUM: 3.3 mmol/L — AB (ref 3.5–5.1)
SODIUM: 157 mmol/L — AB (ref 135–145)

## 2016-01-16 LAB — GLUCOSE, CAPILLARY
GLUCOSE-CAPILLARY: 108 mg/dL — AB (ref 65–99)
GLUCOSE-CAPILLARY: 150 mg/dL — AB (ref 65–99)
GLUCOSE-CAPILLARY: 177 mg/dL — AB (ref 65–99)
GLUCOSE-CAPILLARY: 189 mg/dL — AB (ref 65–99)
GLUCOSE-CAPILLARY: 201 mg/dL — AB (ref 65–99)
GLUCOSE-CAPILLARY: 210 mg/dL — AB (ref 65–99)
Glucose-Capillary: 206 mg/dL — ABNORMAL HIGH (ref 65–99)

## 2016-01-16 LAB — CBC WITH DIFFERENTIAL/PLATELET
BASOS ABS: 0 10*3/uL (ref 0.0–0.1)
Basophils Relative: 0 %
Eosinophils Absolute: 0.1 10*3/uL (ref 0.0–0.7)
Eosinophils Relative: 2 %
HCT: 41.4 % (ref 36.0–46.0)
HEMOGLOBIN: 12 g/dL (ref 12.0–15.0)
LYMPHS ABS: 2.5 10*3/uL (ref 0.7–4.0)
LYMPHS PCT: 40 %
MCH: 29.3 pg (ref 26.0–34.0)
MCHC: 29 g/dL — ABNORMAL LOW (ref 30.0–36.0)
MCV: 101.2 fL — AB (ref 78.0–100.0)
Monocytes Absolute: 0.3 10*3/uL (ref 0.1–1.0)
Monocytes Relative: 5 %
NEUTROS PCT: 53 %
Neutro Abs: 3.3 10*3/uL (ref 1.7–7.7)
PLATELETS: 125 10*3/uL — AB (ref 150–400)
RBC: 4.09 MIL/uL (ref 3.87–5.11)
RDW: 16.4 % — ABNORMAL HIGH (ref 11.5–15.5)
WBC: 6.2 10*3/uL (ref 4.0–10.5)

## 2016-01-16 LAB — SODIUM
SODIUM: 157 mmol/L — AB (ref 135–145)
SODIUM: 161 mmol/L — AB (ref 135–145)
Sodium: 160 mmol/L — ABNORMAL HIGH (ref 135–145)
Sodium: 158 mmol/L — ABNORMAL HIGH (ref 135–145)
Sodium: 156 mmol/L — ABNORMAL HIGH (ref 135–145)

## 2016-01-16 LAB — URINE CULTURE: Culture: >=100000 — AB

## 2016-01-16 LAB — MAGNESIUM
MAGNESIUM: 3.1 mg/dL — AB (ref 1.7–2.4)
Magnesium: 3 mg/dL — ABNORMAL HIGH (ref 1.7–2.4)

## 2016-01-16 LAB — PHOSPHORUS: Phosphorus: 3.4 mg/dL (ref 2.5–4.6)

## 2016-01-16 MED ORDER — MAGNESIUM HYDROXIDE 400 MG/5ML PO SUSP
960.0000 mL | Freq: Once | ORAL | Status: AC
  Administered 2016-01-16: 960 mL via RECTAL
  Filled 2016-01-16: qty 240

## 2016-01-16 MED ORDER — BISACODYL 10 MG RE SUPP: 10.0000 mg | RECTAL | Status: DC
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
## 2016-01-16 NOTE — Progress Notes (Signed)
[facility] Progress Note  Patient: Wanda Hughes ZOX:[id number]   PCP: Eloisa Northern, MD DOB: [birth year]   DOA: 01/13/2016   DOS: 01/16/2016   Date of Service: the patient was seen and examined on 01/16/2016  Subjective: Awake but incoherent and speech. Denies any acute complaint. No acute events overnight. Nutrition: Remains nothing by mouth.  Brief hospital course: Pt. with PMH of dementia; admitted on 01/13/2016, with complaint of confusion, was found to have hypernatremia. Currently further plan is gradual correction of the sodium and resolution of fecal impaction.  Assessment and Plan: 1. Hypernatremia Continue with D5 drip. Gradual correction of the sodium 12 mEq per 24 hour. Monitor neurochecks  2. Fecal impaction.From Severe dehydration SmOG enema. Once impaction is resolved we can initiate stool softener. Repeat x-ray after the enema  3. Dysphagia. Speech therapy following. High likelihood of aspiration events in future. Palliative care consulted.  4. Protein calorie malnutrition. Severe. Dietary consulted. Tube feeding initiated. Magnesium level elevated. Will need to monitor..  4. AKI. Serum creatinine elevated on admission. Improving. Continue monitoring.  5. UTI. Escherichia coli,  pansensitive, given the patient cannot tolerate oral antibiotic I would continue with IV ceftriaxone.  6 sacral decubitus ulcer. Currently has a Foley catheter and mattress. Monitor.  Pain management: When necessary Tylenol Activity: Consulted physical therapy Bowel regimen: last BM 01/15/2016 Diet: On tube feeding, currently nothing by mouth DVT Prophylaxis: subcutaneous Heparin  Advance goals of care discussion: DNR/DNI, no pressors, no PEG tube Highly appreciate input from palliative care and their assistance in deciding goals of care for this patient.  Family Communication: no family was present at bedside, at the time of interview.  Disposition:  Discharge to  SNF. Expected discharge date: 01/19/2016,   Consultants: Palliative care Procedures: None  Antibiotics: Anti-infectives    Start     Dose/Rate Route Frequency Ordered Stop   01/13/16 2330  cefTRIAXone (ROCEPHIN) 1 g in dextrose 5 % 50 mL IVPB     1 g 100 mL/hr over 30 Minutes Intravenous Daily at bedtime 01/13/16 2313     01/13/16 2330  cefTRIAXone (ROCEPHIN) 1 g in dextrose 5 % 50 mL IVPB  Status:  Discontinued     1 g 100 mL/hr over 30 Minutes Intravenous  Once 01/13/16 2317 01/13/16 2317        Intake/Output Summary (Last 24 hours) at 01/16/16 1555 Last data filed at 01/16/16 1300  Gross per 24 hour  Intake          3123.75 ml  Output             1595 ml  Net          1528.75 ml   Filed Weights   01/13/16 2338 01/16/16 0417  Weight: 46.2 kg (101 lb 13.6 oz) 50.8 kg (112 lb)    Objective: Physical Exam: Vitals:   01/16/16 1200 01/16/16 1240 01/16/16 1300 01/16/16 1548  BP: 135/62  (!) 147/59 124/60  Pulse: 65  60 60  Resp: 18  (!) 26 18  Temp:  98.3 F (36.8 C)  98.1 F (36.7 C)  TempSrc:  Axillary  Oral  SpO2: 100%  100%   Weight:      Height:        General: Alert, Awake and Oriented to person. Appear in mild distress Eyes: PERRL, Conjunctiva normal ENT: Oral Mucosa clear dry. Neck: no JVD, no Abnormal Mass Or lumps Cardiovascular: S1 and S2 Present, no Murmur, Respiratory: Bilateral Air entry equal and  Decreased, Clear to Auscultation, no Crackles, no wheezes Abdomen: Bowel Sound present, Soft and no tenderness Skin: no redness, no Rash  Extremities: no Pedal edema, no calf tenderness Neurologic: Grossly no focal neuro deficit. Bilaterally Equal motor strength  Data Reviewed: CBC:  Recent Labs Lab 01/13/16 1808 01/14/16 0419 01/16/16 0927  WBC 9.4 7.9 6.2  NEUTROABS 6.8  --  3.3  HGB 15.2* 13.6 12.0  HCT 40.9 47.9* 41.4  MCV 106.8* 105.3* 101.2*  PLT 106* 139* 125*   Basic Metabolic Panel:  Recent Labs Lab 01/14/16 0419 01/14/16 0701  01/14/16 1107  01/15/16 1237 01/15/16 1716 01/15/16 2114 01/16/16 0039 01/16/16 0446 01/16/16 0927  NA 176* 175* 177*  177*  176*  < > 164* 163* 161* 161* 160* 157*  158*  K 3.5 3.7 3.5  --  4.7  --   --   --   --  3.3*  CL >130* >130* >130*  --  129*  --   --   --   --  119*  CO2 32 30 30  --  27  --   --   --   --  27  GLUCOSE 225* 218* 208*  --  104*  --   --   --   --  205*  BUN 129* 128* 125*  --  87*  --   --   --   --  57*  CREATININE 2.98* 2.97* 2.85*  --  2.10*  --   --   --   --  1.69*  CALCIUM 8.8* 8.8* 8.8*  --  8.3*  --   --   --   --  8.2*  MG  --   --   --   --   --   --   --   --  3.0* 3.1*  PHOS  --   --   --   --   --   --   --   --  3.4  --   < > = values in this interval not displayed.  Liver Function Tests:  Recent Labs Lab 01/13/16 1808 01/15/16 1237  AST 36 70*  ALT 42 48  ALKPHOS 91 80  BILITOT 1.5* 2.4*  PROT 7.4 6.1*  ALBUMIN 3.4* 2.8*    Recent Labs Lab 01/13/16 1808  LIPASE 357*   No results for input(s): AMMONIA in the last 168 hours. Coagulation Profile: No results for input(s): INR, PROTIME in the last 168 hours. Cardiac Enzymes: No results for input(s): CKTOTAL, CKMB, CKMBINDEX, TROPONINI in the last 168 hours. BNP (last 3 results) No results for input(s): PROBNP in the last 8760 hours.  CBG:  Recent Labs Lab 01/15/16 2040 01/16/16 0019 01/16/16 0359 01/16/16 0800 01/16/16 1241  GLUCAP 131* 189* 201* 206* 177*    Studies: No results found.   Scheduled Meds: . [START ON 01/17/2016] bisacodyl  10 mg Rectal QODAY  . cefTRIAXone (ROCEPHIN)  IV  1 g Intravenous QHS  . chlorhexidine  15 mL Mouth Rinse BID  . collagenase   Topical Daily  . free water  150 mL Per Tube 6 X Daily  . heparin  5,000 Units Subcutaneous Q8H  . insulin aspart  0-9 Units Subcutaneous Q4H  . mouth rinse  15 mL Mouth Rinse q12n4p  . sodium chloride flush  3 mL Intravenous Q12H  . sorbitol, milk of mag, mineral oil, glycerin (SMOG) enema  960 mL  Rectal Once   Continuous Infusions: . dextrose  75 mL/hr at 01/16/16 0800  . feeding supplement (JEVITY 1.2 CAL) 1,000 mL (01/16/16 0800)   PRN Meds: acetaminophen **OR** acetaminophen, hydrALAZINE, ondansetron **OR** ondansetron (ZOFRAN) IV  Time spent: 30 minutes  Author: Lynden Oxford, MD [facility] Pager: [phone] 01/16/2016 3:55 PM  If 7PM-7AM, please contact night-coverage at [email or web address], password [facility]

## 2016-01-16 NOTE — Progress Notes (Signed)
Cortrak tube placed in right nare. Inserted feeding tube to 77 cm. Patient moved head throughout placement. KUB xray ordered. Feeding tube added to assessment.Feeding tube is reusable. Please keep tube and stylet if patient removes tube.  Please contact the Cortrak team with any questions or concerns.

## 2016-01-17 LAB — CBC
HEMATOCRIT: 41.3 % (ref 36.0–46.0)
Hemoglobin: 12.3 g/dL (ref 12.0–15.0)
MCH: 29.8 pg (ref 26.0–34.0)
MCHC: 29.8 g/dL — AB (ref 30.0–36.0)
MCV: 100 fL (ref 78.0–100.0)
PLATELETS: 114 10*3/uL — AB (ref 150–400)
RBC: 4.13 MIL/uL (ref 3.87–5.11)
RDW: 15.9 % — AB (ref 11.5–15.5)
WBC: 8.8 10*3/uL (ref 4.0–10.5)

## 2016-01-17 LAB — BASIC METABOLIC PANEL
ANION GAP: 9 (ref 5–15)
BUN: 40 mg/dL — AB (ref 6–20)
CALCIUM: 8.2 mg/dL — AB (ref 8.9–10.3)
CO2: 26 mmol/L (ref 22–32)
Chloride: 120 mmol/L — ABNORMAL HIGH (ref 101–111)
Creatinine, Ser: 1.47 mg/dL — ABNORMAL HIGH (ref 0.44–1.00)
GFR calc Af Amer: 35 mL/min — ABNORMAL LOW (ref >60)
GFR, EST NON AFRICAN AMERICAN: 30 mL/min — AB (ref >60)
GLUCOSE: 123 mg/dL — AB (ref 65–99)
POTASSIUM: 3.5 mmol/L (ref 3.5–5.1)
SODIUM: 155 mmol/L — AB (ref 135–145)

## 2016-01-17 LAB — GLUCOSE, CAPILLARY
GLUCOSE-CAPILLARY: 259 mg/dL — AB (ref 65–99)
GLUCOSE-CAPILLARY: 194 mg/dL — AB (ref 65–99)
GLUCOSE-CAPILLARY: 186 mg/dL — AB (ref 65–99)
Glucose-Capillary: 99 mg/dL (ref 65–99)
Glucose-Capillary: 144 mg/dL — ABNORMAL HIGH (ref 65–99)

## 2016-01-17 LAB — SODIUM
Sodium: 151 mmol/L — ABNORMAL HIGH (ref 135–145)
Sodium: 144 mmol/L (ref 135–145)

## 2016-01-17 LAB — MAGNESIUM: Magnesium: 3.3 mg/dL — ABNORMAL HIGH (ref 1.7–2.4)

## 2016-01-17 LAB — PHOSPHORUS: Phosphorus: 3.3 mg/dL (ref 2.5–4.6)

## 2016-01-17 MED ORDER — SENNOSIDES-DOCUSATE SODIUM 8.6-50 MG PO TABS
2.0000 | ORAL_TABLET | Freq: Two times a day (BID) | ORAL | Status: DC
Start: 2016-01-17 — End: 2016-01-17

## 2016-01-17 MED ORDER — POLYETHYLENE GLYCOL 3350 17 G PO PACK
17.0000 g | PACK | Freq: Every day | ORAL | Status: DC
  Administered 2016-01-18 – 2016-01-20 (×3): 17 g via ORAL
  Filled 2016-01-17 (×3): qty 1

## 2016-01-17 MED ORDER — SENNOSIDES-DOCUSATE SODIUM 8.6-50 MG PO TABS
1.0000 | ORAL_TABLET | Freq: Two times a day (BID) | ORAL | Status: DC
  Administered 2016-01-17 – 2016-01-20 (×5): 1 via ORAL
  Filled 2016-01-17 (×6): qty 1

## 2016-01-17 MED ORDER — POLYETHYLENE GLYCOL 3350 17 G PO PACK: 17.0000 g | PACK | Freq: Two times a day (BID) | ORAL | Status: DC

## 2016-01-17 NOTE — Evaluation (Signed)
Clinical/Bedside Swallow Evaluation Patient Details  Name: Wanda Hughes MRN: [id number] Date of Birth: [birth year]  Today's Date: 01/17/2016 Time: SLP Start Time (ACUTE ONLY): 1105 SLP Stop Time (ACUTE ONLY): 1135 SLP Time Calculation (min) (ACUTE ONLY): 30 min  Past Medical History:  Past Medical History:  Diagnosis Date  . Anxiety   . Chronic diastolic (congestive) heart failure (HCC)   . CKD (chronic kidney disease), stage III   . Constipation   . Dementia in Alzheimer's disease   . Dyslipidemia   . Hyperlipemia   . Hypertension   . Pseudobulbar affect   . Subarachnoid hemorrhage, nontraumatic (HCC)   . TIA (transient ischemic attack)    Past Surgical History:  Past Surgical History:  Procedure Laterality Date  . dyslipidemia     HPI:   82 y.o. female  with past medical history of Severe Alzheimer's disease, hypertension, hyperlipidemia, anxiety, TIA, subarachnoid hemorrhage 2015, chronic kidney disease stage III, diastolic heart failure admitted on 01/13/2016 with failure to thrive and poor appetite. X-ray of the abdomen was done and skilled nursing facility which showed a possible pneumoperitoneum and patient was transferred to the emergency room. Patient is nonverbal upon arrival. She was found to have a sodium level of 177 creatinine of 3.33 (a sign creatinine 1.3), blood sugar 59, lipase 357, bradycardia,. Patient had a severe impaction per CT of the abdomen and pelvis. She also had a urinary tract infection.. Tube feedings via NG tube was started until patient's goals of care could be ascertained as well as input from speech therapy    Assessment / Plan / Recommendation Clinical Impression  Patient presents with a moderate oral pharyngeal dysphagia. Pt currently has a NG in place with TF running. She was able to answer simple y/n questions, but unable to follow simple commands. Pt responded appropriately to food presentation by opening mouth and chewing. She was given ice chips,  water by spoon, nectar thickened water, apple sauce and a small piece of cracker. Pt tolerated the ice chip, nectar thick liquid by cup sip and apple sauce with no s/s of aspiration. She did however have a delayed cough with thin liquids by spoon and with solids. Suspect the NG may be playing a factor in her swallowing abilities, but her cognitive status is likely contributing to her dysphagia. Recommend patient begin a diet of Dys 1, nectar thickened liquids with small pills whole and large pills crushed with puree. Pt would likely swallow better without NG tube. Speech therapy will continue to follow up for diet tolerance.    Aspiration Risk  Moderate aspiration risk    Diet Recommendation Dysphagia 1 (Puree);Nectar-thick liquid   Liquid Administration via: Cup;No straw Medication Administration: Crushed with puree Supervision: Full supervision/cueing for compensatory strategies Compensations: Slow rate;Small sips/bites Postural Changes: Seated upright at 90 degrees;Remain upright for at least 30 minutes after po intake    Other  Recommendations Oral Care Recommendations: Oral care BID Other Recommendations: Order thickener from pharmacy;Remove water pitcher   Follow up Recommendations  Skilled Nursing facility    Frequency and Duration min 2x/week  1 week       Prognosis Prognosis for Safe Diet Advancement: Guarded Barriers to Reach Goals: Cognitive deficits;Language deficits;Severity of deficits      Swallow Study   General Date of Onset: 01/15/16 HPI:  82 y.o. female  with past medical history of Severe Alzheimer's disease, hypertension, hyperlipidemia, anxiety, TIA, subarachnoid hemorrhage 2015, chronic kidney disease stage III, diastolic heart failure admitted on  01/13/2016 with failure to thrive and poor appetite. X-ray of the abdomen was done and skilled nursing facility which showed a possible pneumoperitoneum and patient was transferred to the emergency room. Patient is  nonverbal upon arrival. She was found to have a sodium level of 177 creatinine of 3.33 (a sign creatinine 1.3), blood sugar 59, lipase 357, bradycardia,. Patient had a severe impaction per CT of the abdomen and pelvis. She also had a urinary tract infection.. Tube feedings via NG tube was started until patient's goals of care could be ascertained as well as input from speech therapy  Type of Study: Bedside Swallow Evaluation Previous Swallow Assessment: no Diet Prior to this Study: NPO;NG Tube Temperature Spikes Noted: No History of Recent Intubation:  (low grade temps) Behavior/Cognition: Alert;Pleasant mood;Requires cueing;Doesn't follow directions Oral Cavity Assessment: Dry (dry mouth, skin peeling on tongue) Oral Care Completed by SLP: Yes Oral Cavity - Dentition: Missing dentition Self-Feeding Abilities: Total assist (Pt has mittens.) Patient Positioning: Upright in bed;Postural control adequate for testing Baseline Vocal Quality: Low vocal intensity Volitional Cough: Weak Volitional Swallow: Unable to elicit    Oral/Motor/Sensory Function Overall Oral Motor/Sensory Function: Moderate impairment Facial ROM: Within Functional Limits Facial Symmetry: Within Functional Limits Facial Strength: Within Functional Limits Facial Sensation: Within Functional Limits Lingual ROM: Within Functional Limits Lingual Symmetry: Within Functional Limits Lingual Strength: Reduced Lingual Sensation: Within Functional Limits   Ice Chips Ice chips: Within functional limits Presentation: Spoon   Thin Liquid Thin Liquid: Impaired Presentation: Spoon Oral Phase Impairments: Poor awareness of bolus;Reduced labial seal Oral Phase Functional Implications: Prolonged oral transit Pharyngeal  Phase Impairments: Suspected delayed Swallow;Cough - Delayed    Nectar Thick Nectar Thick Liquid: Within functional limits Presentation: Spoon;Cup   Honey Thick Honey Thick Liquid: Not tested   Puree Puree: Within  functional limits   Solid   GO   Solid: Impaired Presentation: Spoon Oral Phase Impairments: Reduced lingual movement/coordination;Poor awareness of bolus;Impaired mastication Oral Phase Functional Implications: Impaired mastication;Prolonged oral transit;Oral holding Pharyngeal Phase Impairments: Suspected delayed Swallow;Cough - Delayed    Functional Assessment Tool Used: Bedside Swallow Evaluation Functional Limitations: Swallowing Swallow Current Status ([id number]([id number]): At least 60 percent but less than 80 percent impaired, limited or restricted Swallow Goal Status [phone]([id number]): At least 60 percent but less than 80 percent impaired, limited or restricted   Lindalou HoseSarah J. Karesa Maultsby, MA, CCC-SLP 01/17/2016 11:50 AM

## 2016-01-17 NOTE — Progress Notes (Signed)
[facility] Progress Note  Patient: Wanda Hughes ZOX:[id number]   PCP: Eloisa Northern, MD DOB: [birth year]   DOA: 01/13/2016   DOS: 01/17/2016   Date of Service: the patient was seen and examined on 01/17/2016  Subjective: Awake but remains incoherent with her speech. Lethargic after speech therapy evaluation and perked up later again. Has been having bowel movements. Nutrition: Tolerating liquids  Brief hospital course: Pt. with PMH of dementia; admitted on 01/13/2016, with complaint of confusion, was found to have hypernatremia. Hypernatremia as well as acute kidney injury was thought to be from profound dehydration. With IV D5 both has been gradually corrected. Patient had significant fecal impaction and required multiple bouts of enema and currently has regular bowel movement. Currently further plan is gradual correction of the sodium and resolution of fecal impaction.  Assessment and Plan: 1. Hypernatremia Continue with D5 drip. Reduce the rate to 50 mL per hour. Gradual correction of the sodium 12 mEq per 24 hour. Monitor neurochecks  2. Fecal impaction.From Severe dehydration Although axillary mentions that the patient still has impaction clinically the patient is having tube feeding-like bowel movement. Based on this we will transition patient to oral MiraLAX and Senokot. Continue to closely monitor.  3. Dysphagia. Speech therapy following. High likelihood of aspiration events in future. Palliative care consulted. Currently the patient will be on dysphagia type I diet. NG tube will be removed as well as tube feeding will be stopped. Monitor for evidence of dehydration.  4. Protein calorie malnutrition. Severe. Dietary consulted. Tube feeding initiated. Magnesium level elevated. Will need to monitor.. Monitor for evidence of recurrent dehydration after stopping the tube feeding.  4. AKI. Serum creatinine elevated on admission. Improving. Continue monitoring.  5. UTI.  Escherichia coli,  pansensitive, given the patient cannot tolerate oral antibiotic I would continue with IV ceftriaxone. 5 days of antibiotic with last dose on 01/17/2016.  6 sacral decubitus ulcer. Currently has a Foley catheter and mattress. Monitor.  Pain management: When necessary Tylenol Activity: Consulted physical therapy Bowel regimen: last BM 01/17/2016 Diet: Dysphagia diet DVT Prophylaxis: subcutaneous Heparin  Advance goals of care discussion: DNR/DNI, no pressors, no PEG tube Highly appreciate input from palliative care and their assistance in deciding goals of care for this patient.  Family Communication: no family was present at bedside, at the time of interview.  Disposition:  Discharge to SNF. Expected discharge date: 01/19/2016, pending improvement in sodium and renal function  Consultants: Palliative care Procedures: None  Antibiotics: Anti-infectives    Start     Dose/Rate Route Frequency Ordered Stop   01/13/16 2330  cefTRIAXone (ROCEPHIN) 1 g in dextrose 5 % 50 mL IVPB     1 g 100 mL/hr over 30 Minutes Intravenous Daily at bedtime 01/13/16 2313     01/13/16 2330  cefTRIAXone (ROCEPHIN) 1 g in dextrose 5 % 50 mL IVPB  Status:  Discontinued     1 g 100 mL/hr over 30 Minutes Intravenous  Once 01/13/16 2317 01/13/16 2317        Intake/Output Summary (Last 24 hours) at 01/17/16 1830 Last data filed at 01/17/16 1600  Gross per 24 hour  Intake          2524.17 ml  Output              576 ml  Net          1948.17 ml   Filed Weights   01/13/16 2338 01/16/16 0417 01/17/16 0328  Weight: 46.2 kg (101  lb 13.6 oz) 50.8 kg (112 lb) 51.7 kg (114 lb)    Objective: Physical Exam: Vitals:   01/17/16 0400 01/17/16 0543 01/17/16 0827 01/17/16 1714  BP: (!) 136/54 121/63 (!) 123/92 112/87  Pulse: 75 72 62 65  Resp: 15 18 18 18   Temp:  (!) 96.4 F (35.8 C) 98.7 F (37.1 C) 98.1 F (36.7 C)  TempSrc:  Axillary Oral Oral  SpO2: 100% 98% 98% 98%  Weight:        Height:        General: Alert, Awake and Oriented to person. Appear in mild distress Eyes: PERRL, Conjunctiva normal ENT: Oral Mucosa clear dry. Neck: no JVD, no Abnormal Mass Or lumps Cardiovascular: S1 and S2 Present, no Murmur, Respiratory: Bilateral Air entry equal and Decreased, Clear to Auscultation, no Crackles, no wheezes Abdomen: Bowel Sound present, Soft and no tenderness Skin: no redness, no Rash  Extremities: no Pedal edema, no calf tenderness Neurologic: Grossly no focal neuro deficit. Bilaterally Equal motor strength  Data Reviewed: CBC:  Recent Labs Lab 01/13/16 1808 01/14/16 0419 01/16/16 0927 01/17/16 0632  WBC 9.4 7.9 6.2 8.8  NEUTROABS 6.8  --  3.3  --   HGB 15.2* 13.6 12.0 12.3  HCT 40.9 47.9* 41.4 41.3  MCV 106.8* 105.3* 101.2* 100.0  PLT 106* 139* 125* 114*   Basic Metabolic Panel:  Recent Labs Lab 01/14/16 0701 01/14/16 1107  01/15/16 1237  01/16/16 0446 01/16/16 0927 01/16/16 1557 01/16/16 2259 01/17/16 0632 01/17/16 1324  NA 175* 177*  177*  176*  < > 164*  < > 160* 157*  158* 157* 156* 155* 151*  K 3.7 3.5  --  4.7  --   --  3.3*  --   --  3.5  --   CL >130* >130*  --  129*  --   --  119*  --   --  120*  --   CO2 30 30  --  27  --   --  27  --   --  26  --   GLUCOSE 218* 208*  --  104*  --   --  205*  --   --  123*  --   BUN 128* 125*  --  87*  --   --  57*  --   --  40*  --   CREATININE 2.97* 2.85*  --  2.10*  --   --  1.69*  --   --  1.47*  --   CALCIUM 8.8* 8.8*  --  8.3*  --   --  8.2*  --   --  8.2*  --   MG  --   --   --   --   --  3.0* 3.1*  --   --  3.3*  --   PHOS  --   --   --   --   --  3.4  --   --   --  3.3  --   < > = values in this interval not displayed.  Liver Function Tests:  Recent Labs Lab 01/13/16 1808 01/15/16 1237  AST 36 70*  ALT 42 48  ALKPHOS 91 80  BILITOT 1.5* 2.4*  PROT 7.4 6.1*  ALBUMIN 3.4* 2.8*    Recent Labs Lab 01/13/16 1808  LIPASE 357*   No results for input(s): AMMONIA in the  last 168 hours. Coagulation Profile: No results for input(s): INR, PROTIME in the last 168 hours. Cardiac  Enzymes: No results for input(s): CKTOTAL, CKMB, CKMBINDEX, TROPONINI in the last 168 hours. BNP (last 3 results) No results for input(s): PROBNP in the last 8760 hours.  CBG:  Recent Labs Lab 01/16/16 2315 01/17/16 0332 01/17/16 0737 01/17/16 1116 01/17/16 1646  GLUCAP 150* 259* 99 194* 186*    Studies: Dg Abd Portable 1v  Result Date: 01/16/2016 CLINICAL DATA:  Feeding tube placement EXAM: PORTABLE ABDOMEN - 1 VIEW COMPARISON:  None. FINDINGS: There is normal small bowel gas pattern. There is NG feeding tube with tip in distal duodenum. IMPRESSION: NG feeding tube with tip in distal duodenum. Electronically Signed   By: Natasha MeadLiviu  Pop M.D.   On: 01/16/2016 20:28   Dg Abd Portable 1v  Result Date: 01/16/2016 CLINICAL DATA:  Fecal impaction. EXAM: PORTABLE ABDOMEN - 1 VIEW COMPARISON:  01/15/2016 FINDINGS: The bowel gas pattern is nonobstructive. Again seen is large amount of formed stool within the rectum consistent with fecal impaction. No evidence of organomegaly. IMPRESSION: Constipation with evidence of fecal impaction. Nonobstructive bowel gas pattern. Electronically Signed   By: Ted Mcalpineobrinka  Dimitrova M.D.   On: 01/16/2016 20:23     Scheduled Meds: . cefTRIAXone (ROCEPHIN)  IV  1 g Intravenous QHS  . chlorhexidine  15 mL Mouth Rinse BID  . collagenase   Topical Daily  . heparin  5,000 Units Subcutaneous Q8H  . insulin aspart  0-9 Units Subcutaneous Q4H  . mouth rinse  15 mL Mouth Rinse q12n4p  . [START ON 01/18/2016] polyethylene glycol  17 g Oral Daily  . senna-docusate  1 tablet Oral BID  . sodium chloride flush  3 mL Intravenous Q12H   Continuous Infusions: . dextrose 75 mL (01/16/16 2012)   PRN Meds: acetaminophen **OR** acetaminophen, hydrALAZINE, ondansetron **OR** ondansetron (ZOFRAN) IV  Time spent: 30 minutes  Author: Lynden OxfordPranav Sargun Rummell, MD [facility] Pager: [phone] 01/17/2016 6:30 PM  If 7PM-7AM, please contact night-coverage at [email or web address], password [email or web address]

## 2016-01-17 NOTE — Progress Notes (Signed)
Per md ok to restart tube feedings Wanda Hughes, Libbey Duce T

## 2016-01-17 NOTE — Progress Notes (Signed)
Pt tolerated removal of Nasal Feed tube (DHT) well. Pt ate some lunch and swallowed well. She coughed a little at the start but that resolved and she tolerated small amounts of thickened liquids well.

## 2016-01-17 NOTE — Progress Notes (Signed)
Called and left msg with son lewis about pts transfer to [phone].  Moved pt via bed. Karena Addisonoro, Shyne Lehrke T

## 2016-01-17 NOTE — Progress Notes (Signed)
Re-paged md for clarification of cortrak placement if ok to restart tube feedings.  Also if pt able to move to floor.  Will continue to monitor Karena AddisonCoro, Rishikesh Khachatryan T

## 2016-01-17 NOTE — Progress Notes (Signed)
Transfer note:  Was ask to evaluate pt for possible transfer out of SDU. Pt admitted for hypernatremia on 01/13/2016. Hospitalization has been complicated by fecal impaction, dysphagia, UTI, AKI and severe protein malnutriton. Na is slowly trending down and fecal impaction is resolving after enema. Feeding tube is well positioned per abd xray, and tube feedings to restart.Vital signs are stable w/ 02 sats of %100 on r/a. Current plan includes d/c to SNF in approx 2 days. Pt DNR. Palliative care team following. Review of record indicates pt is appropriate for transfer to med-surg. RR RN "Brook" saw pt at bedside as I am not on campus. She agrees pt appears appropriate for transfer. Order placed for transfer to med-surg. Company secretarylow manager notified of transfer.  Leanne ChangKatherine P. Quinnie Barcelo, NP-C [facility] Pager [phone]

## 2016-01-18 ENCOUNTER — Encounter (HOSPITAL_COMMUNITY): Payer: Self-pay | Admitting: *Deleted

## 2016-01-18 DIAGNOSIS — I509 Heart failure, unspecified: Secondary | ICD-10-CM

## 2016-01-18 DIAGNOSIS — E876 Hypokalemia: Secondary | ICD-10-CM

## 2016-01-18 LAB — BASIC METABOLIC PANEL
ANION GAP: 6 (ref 5–15)
BUN: 25 mg/dL — ABNORMAL HIGH (ref 6–20)
CO2: 28 mmol/L (ref 22–32)
Calcium: 8 mg/dL — ABNORMAL LOW (ref 8.9–10.3)
Chloride: 116 mmol/L — ABNORMAL HIGH (ref 101–111)
Creatinine, Ser: 1.09 mg/dL — ABNORMAL HIGH (ref 0.44–1.00)
GFR calc Af Amer: 51 mL/min — ABNORMAL LOW (ref >60)
GFR, EST NON AFRICAN AMERICAN: 44 mL/min — AB (ref >60)
GLUCOSE: 113 mg/dL — AB (ref 65–99)
POTASSIUM: 3 mmol/L — AB (ref 3.5–5.1)
Sodium: 150 mmol/L — ABNORMAL HIGH (ref 135–145)

## 2016-01-18 LAB — SODIUM
Sodium: 150 mmol/L — ABNORMAL HIGH (ref 135–145)
Sodium: 145 mmol/L (ref 135–145)
Sodium: 149 mmol/L — ABNORMAL HIGH (ref 135–145)

## 2016-01-18 LAB — GLUCOSE, CAPILLARY
GLUCOSE-CAPILLARY: 102 mg/dL — AB (ref 65–99)
GLUCOSE-CAPILLARY: 141 mg/dL — AB (ref 65–99)
GLUCOSE-CAPILLARY: 152 mg/dL — AB (ref 65–99)
Glucose-Capillary: 114 mg/dL — ABNORMAL HIGH (ref 65–99)
Glucose-Capillary: 144 mg/dL — ABNORMAL HIGH (ref 65–99)
Glucose-Capillary: 165 mg/dL — ABNORMAL HIGH (ref 65–99)
Glucose-Capillary: 77 mg/dL (ref 65–99)

## 2016-01-18 LAB — PHOSPHORUS: Phosphorus: 3.4 mg/dL (ref 2.5–4.6)

## 2016-01-18 LAB — MAGNESIUM: Magnesium: 3 mg/dL — ABNORMAL HIGH (ref 1.7–2.4)

## 2016-01-18 MED ORDER — POTASSIUM CHLORIDE 10 MEQ/100ML IV SOLN
10.0000 meq | INTRAVENOUS | Status: AC
  Administered 2016-01-18 (×3): 10 meq via INTRAVENOUS
  Filled 2016-01-18 (×3): qty 100

## 2016-01-18 MED ORDER — ENSURE ENLIVE PO LIQD: 237.0000 mL | Freq: Three times a day (TID) | ORAL | Status: DC

## 2016-01-18 NOTE — Consult Note (Signed)
WOC Nurse wound consult note Reason for Consult: Pressure wound to sacrum Wound type: Pressure in combination with moisture from feces/urine (currently has a foley) Pressure Ulcer POA: Yes Measurement:  5 cm x 5 cm with very small satellite lesions.  75% yellow slough to main wound bed; no odor; very little drainage.  Periwound: fragile, thin skin, slightly darker in color, but blanches Dressing procedure/placement/frequency:  Continue with Santyl, saline moistened gauze and foam dressing.  See wound care instructions.  Continue with air mattress.   Discussed POC with patient and bedside nurse.  Re consult if needed, will not follow at this time. Thanks Helmut MusterSherry Lantz Hermann MSN, RN, CNS-BC, [employer]OCN

## 2016-01-18 NOTE — Progress Notes (Signed)
PT Cancellation and Discharge Note  Patient Details Name: Wanda Hughes MRN: [id number] DOB: 09-Jul-1924   Cancelled Treatment:    Reason Eval/Treat Not Completed: PT screened, no needs identified, will sign off   Was totally dependent for mobility and ADLs at SNF; at baseline level of function; Recommend mechanical lift for transfers OOB;   Van ClinesHolly Aleayah [place], [place]PT  Acute Rehabilitation Services Pager [phone] Office [phone]    Van ClinesGarrigan, Barbera Perritt [facility] 01/18/2016, 11:23 AM

## 2016-01-18 NOTE — Clinical Social Work Note (Signed)
CSW talked with Lynden Angathy, nurse liaison with [facility] and informed her that patient will be ready for d/c on Tuesday, 9/12. [facility] [place] learned that [place] will [place] using her Medicaid benefits, so CSW does not need to pursue authorization with [facility].  Genelle BalVanessa Krosby Ritchie, MSW, LCSW Licensed Clinical Social Worker Clinical Social Work Department [facility] [phone]

## 2016-01-18 NOTE — Progress Notes (Signed)
Nutrition Follow-up  DOCUMENTATION CODES:   Underweight, Severe malnutrition in context of chronic illness  INTERVENTION:  Provide Ensure Enlive po TID, each supplement provides 350 kcal and 20 grams of protein.  Encourage adequate PO intake.   NUTRITION DIAGNOSIS:   Malnutrition related to chronic illness as evidenced by severe depletion of body fat, severe depletion of muscle mass; ongoing  GOAL:   Patient will meet greater than or equal to 90% of their needs; not met  MONITOR:   PO intake, Supplement acceptance, Labs, Weight trends, Skin, I & O's  REASON FOR ASSESSMENT:   Consult Enteral/tube feeding initiation and management  ASSESSMENT:   HYACINTH MARCELLI is a 82 y.o. female with medical history significant of Alzheimer's disease, hypertension, hyperlipidemia, anxiety, TIA, SAH 2015, CKD-III, dCHF, who presents with failure to thrive and  poor appetite.  NGT removed, TF stopped 9/10. During time of visit, pt did not respond to questions asked. No family at bedside. Pt is currently on a dysphagia 1 diet with nectar thick liquids. Meal completion has been 25-50%. RD to order Ensure to aid in caloric and protein needs.   Labs and medications reviewed. Sodium and chloride elevated.   Diet Order:  DIET - DYS 1 Room service appropriate? Yes; Fluid consistency: Nectar Thick  Skin:  Wound (see comment) (3 pressure injuries on sacrum (UN, st II, st III))  Last BM:  9/10  Height:   Ht Readings from Last 1 Encounters:  01/13/16 5' 4" (1.626 m)    Weight:   Wt Readings from Last 1 Encounters:  01/18/16 119 lb 14.9 oz (54.4 kg)    Ideal Body Weight:  54.5 kg  BMI:  Body mass index is 20.59 kg/m.  Estimated Nutritional Needs:   Kcal:  1400-1600  Protein:  65-80 grams  Fluid:  >1.4 L  EDUCATION NEEDS:   No education needs identified at this time  Corrin Parker, MS, RD, LDN Pager # [phone] After hours/ weekend pager # [phone]

## 2016-01-18 NOTE — Progress Notes (Addendum)
[facility]  PROGRESS NOTE  Wanda Hughes ZOX:[id number] DOB: 10/04/1924 DOA: 01/13/2016 PCP: Eloisa Northern, MD    Brief HPI:  82 y.o. female with medical history significant of Alzheimer's disease, hypertension, hyperlipidemia, anxiety, TIA, SAH 2015, CKD-III, dCHF, who presents with failure to thrive and  poor appetite.  Patient has dementia and possible worsening mental status, is unable to provide any accurate medical history, therefore, most of the history is obtained by discussing the case with ED physician, per EMS report, and with the nursing staff. History is very limited. It seems that pt has decreased oral intake and failure to thrive recently in SNF. X-ray of abd was done in SNF, which showed possible pneumoperitoneum today, therefore pt is transferred to ED for further evaluation and treatment.  pt was found to have sodium 177, chloride 1:30, potassium 3.8, bicarbonate 31, BUN 140, creatinine 3.33 (baseline creatinine 1.3), blood sugar by 59, lipase 357, troponin negative, WBC 9.4, hemoglobin 15.2, previously to 106, bradycardia, no tachypnea, oxygen saturation 94% on room air, pending urinalysis.     Assessment/Plan:    1. Hypernatremia Sodium has improved to 150, continued on D5W at 50 mL per hour. Monitor neurochecks Follow BMP in a.m.  2. Fecal impaction. From Severe dehydration Continue oral MiraLAX and Senokot. Continue to closely monitor.  3. Dysphagia. Speech therapy following. High likelihood of aspiration events in future. Palliative care consulted. Currently the patient will be on dysphagia type I diet. NG tube will be removed as well as tube feeding will be stopped. Monitor for evidence of dehydration.  4. Protein calorie malnutrition. Severe. Dietary consulted. Tube feeding was initiated via NG tube and has been discontinued as above. Magnesium level elevated. Will need to monitor.. Monitor for evidence of recurrent dehydration after stopping the  tube feeding.  4. AKI. Serum creatinine elevated on admission. Improving. Continue monitoring.  5. UTI.  Escherichia coli, growing in  urine culture pansensitive, given the patient cannot tolerate oral antibiotic I would continue with IV ceftriaxone. 5 days of antibiotic with last dose on 01/17/2016.  6. Sacral decubitus ulcer.  Currently has a Foley catheter and mattress.   Monitor.  7. Hypokalemia Replace  potassium and check BMP in a.m.   DVT prophylaxis: Heparin Code Status: DO NOT RESUSCITATE Family Communication: no family at bedside  Disposition Plan: Skilled nursing facility   Consultants:  None   Procedures:  None   Antibiotics: Anti-infectives    Start     Dose/Rate Route Frequency Ordered Stop   01/13/16 2330  cefTRIAXone (ROCEPHIN) 1 g in dextrose 5 % 50 mL IVPB  Status:  Discontinued     1 g 100 mL/hr over 30 Minutes Intravenous Daily at bedtime 01/13/16 2313 01/18/16 1026   01/13/16 2330  cefTRIAXone (ROCEPHIN) 1 g in dextrose 5 % 50 mL IVPB  Status:  Discontinued     1 g 100 mL/hr over 30 Minutes Intravenous  Once 01/13/16 2317 01/13/16 2317       Subjective   Patient seen and examined, unable to provide history due to underlying dementia. Sodium is slowly improving.  Objective    Objective: Vitals:   01/17/16 2043 01/18/16 0419 01/18/16 0452 01/18/16 1000  BP: (!) 119/56 (!) 113/53  140/69  Pulse: 62 (!) 58  72  Resp: 19 16  18   Temp: 97.9 F (36.6 C) 97.5 F (36.4 C)  98 F (36.7 C)  TempSrc: Oral Axillary  Oral  SpO2: 98% 99%  98%  Weight: 54.4  kg (120 lb)  54.4 kg (119 lb 14.9 oz)   Height:        Intake/Output Summary (Last 24 hours) at 01/18/16 1134 Last data filed at 01/18/16 1000  Gross per 24 hour  Intake             1330 ml  Output              600 ml  Net              730 ml   Filed Weights   01/17/16 0328 01/17/16 2043 01/18/16 0452  Weight: 51.7 kg (114 lb) 54.4 kg (120 lb) 54.4 kg (119 lb 14.9 oz)     Examination:  General exam: Appears calm and comfortable  Respiratory system: Clear to auscultation. Respiratory effort normal. Cardiovascular system: S1 & S2 heard, RRR. No JVD, murmurs, rubs, gallops or clicks. No pedal edema. Gastrointestinal system: Abdomen is nondistended, soft and nontender. No organomegaly or masses felt. Normal bowel sounds heard. Central nervous system: Alert. No focal neurological deficits. Extremities: Symmetric 5 x 5 power. As you are  Data Reviewed: I have personally reviewed following labs and imaging studies Basic Metabolic Panel:  Recent Labs Lab 01/14/16 1107  01/15/16 1237  01/16/16 0446 01/16/16 0927  01/17/16 0632 01/17/16 1324 01/17/16 1837 01/18/16 0026 01/18/16 0348  NA 177*  177*  176*  < > 164*  < > 160* 157*  158*  < > 155* 151* 144 150* 150*  K 3.5  --  4.7  --   --  3.3*  --  3.5  --   --   --  3.0*  CL >130*  --  129*  --   --  119*  --  120*  --   --   --  116*  CO2 30  --  27  --   --  27  --  26  --   --   --  28  GLUCOSE 208*  --  104*  --   --  205*  --  123*  --   --   --  113*  BUN 125*  --  87*  --   --  57*  --  40*  --   --   --  25*  CREATININE 2.85*  --  2.10*  --   --  1.69*  --  1.47*  --   --   --  1.09*  CALCIUM 8.8*  --  8.3*  --   --  8.2*  --  8.2*  --   --   --  8.0*  MG  --   --   --   --  3.0* 3.1*  --  3.3*  --   --   --  3.0*  PHOS  --   --   --   --  3.4  --   --  3.3  --   --   --  3.4  < > = values in this interval not displayed. Liver Function Tests:  Recent Labs Lab 01/13/16 1808 01/15/16 1237  AST 36 70*  ALT 42 48  ALKPHOS 91 80  BILITOT 1.5* 2.4*  PROT 7.4 6.1*  ALBUMIN 3.4* 2.8*    Recent Labs Lab 01/13/16 1808  LIPASE 357*   No results for input(s): AMMONIA in the last 168 hours. CBC:  Recent Labs Lab 01/13/16 1808 01/14/16 0419 01/16/16 0927 01/17/16 0632  WBC 9.4 7.9 6.2 8.8  NEUTROABS 6.8  --  3.3  --   HGB 15.2* 13.6 12.0 12.3  HCT 40.9 47.9* 41.4 41.3  MCV  106.8* 105.3* 101.2* 100.0  PLT 106* 139* 125* 114*   Cardiac Enzymes: No results for input(s): CKTOTAL, CKMB, CKMBINDEX, TROPONINI in the last 168 hours. BNP (last 3 results) No results for input(s): BNP in the last 8760 hours.  ProBNP (last 3 results) No results for input(s): PROBNP in the last 8760 hours.  CBG:  Recent Labs Lab 01/17/16 1646 01/17/16 2042 01/18/16 0017 01/18/16 0447 01/18/16 0834  GLUCAP 186* 144* 77 102* 141*    Recent Results (from the past 240 hour(s))  Urine culture     Status: Abnormal   Collection Time: 01/13/16  1:29 AM  Result Value Ref Range Status   Specimen Description URINE, CATHETERIZED  Final   Special Requests NONE  Final   Culture >=100,000 COLONIES/mL ESCHERICHIA COLI (A)  Final   Report Status 01/16/2016 FINAL  Final   Organism ID, Bacteria ESCHERICHIA COLI (A)  Final      Susceptibility   Escherichia coli - MIC*    AMPICILLIN 4 SENSITIVE Sensitive     CEFAZOLIN <=4 SENSITIVE Sensitive     CEFTRIAXONE <=1 SENSITIVE Sensitive     CIPROFLOXACIN 0.5 SENSITIVE Sensitive     GENTAMICIN <=1 SENSITIVE Sensitive     IMIPENEM <=0.25 SENSITIVE Sensitive     NITROFURANTOIN <=16 SENSITIVE Sensitive     TRIMETH/SULFA <=20 SENSITIVE Sensitive     AMPICILLIN/SULBACTAM 4 SENSITIVE Sensitive     PIP/TAZO <=4 SENSITIVE Sensitive     Extended ESBL NEGATIVE Sensitive     * >=100,000 COLONIES/mL ESCHERICHIA COLI  MRSA PCR Screening     Status: None   Collection Time: 01/13/16 11:26 PM  Result Value Ref Range Status   MRSA by PCR NEGATIVE NEGATIVE Final    Comment:        The GeneXpert MRSA Assay (FDA approved for NASAL specimens only), is one component of a comprehensive MRSA colonization surveillance program. It is not intended to diagnose MRSA infection nor to guide or monitor treatment for MRSA infections.   Blood culture (routine x 2)     Status: None (Preliminary result)   Collection Time: 01/14/16 12:09 AM  Result Value Ref Range  Status   Specimen Description BLOOD LEFT HAND  Final   Special Requests IN PEDIATRIC BOTTLE 2ML  Final   Culture NO GROWTH 3 DAYS  Final   Report Status PENDING  Incomplete  Blood culture (routine x 2)     Status: None (Preliminary result)   Collection Time: 01/14/16 12:19 AM  Result Value Ref Range Status   Specimen Description BLOOD RIGHT ARM  Final   Special Requests BOTTLES DRAWN AEROBIC AND ANAEROBIC 5ML  Final   Culture NO GROWTH 3 DAYS  Final   Report Status PENDING  Incomplete     Studies: Dg Abd Portable 1v  Result Date: 01/16/2016 CLINICAL DATA:  Feeding tube placement EXAM: PORTABLE ABDOMEN - 1 VIEW COMPARISON:  None. FINDINGS: There is normal small bowel gas pattern. There is NG feeding tube with tip in distal duodenum. IMPRESSION: NG feeding tube with tip in distal duodenum. Electronically Signed   By: Natasha MeadLiviu  Pop M.D.   On: 01/16/2016 20:28   Dg Abd Portable 1v  Result Date: 01/16/2016 CLINICAL DATA:  Fecal impaction. EXAM: PORTABLE ABDOMEN - 1 VIEW COMPARISON:  01/15/2016 FINDINGS: The bowel gas pattern is nonobstructive. Again seen is large amount of  formed stool within the rectum consistent with fecal impaction. No evidence of organomegaly. IMPRESSION: Constipation with evidence of fecal impaction. Nonobstructive bowel gas pattern. Electronically Signed   By: Ted Mcalpine M.D.   On: 01/16/2016 20:23    Scheduled Meds: . chlorhexidine  15 mL Mouth Rinse BID  . collagenase   Topical Daily  . heparin  5,000 Units Subcutaneous Q8H  . insulin aspart  0-9 Units Subcutaneous Q4H  . mouth rinse  15 mL Mouth Rinse q12n4p  . polyethylene glycol  17 g Oral Daily  . senna-docusate  1 tablet Oral BID  . sodium chloride flush  3 mL Intravenous Q12H   Continuous Infusions: . dextrose 50 mL/hr at 01/17/16 2100       Time spent: 25 min    [facility]  [facility] Pager [phone]. If 7PM-7AM, please contact night-coverage at [email or web address], Office   [phone]  password [email or web address] 01/18/2016, 11:34 AM  LOS: 5 days

## 2016-01-18 NOTE — Evaluation (Signed)
Physical Therapy Evaluation Patient Details Name: Wanda Hughes MRN: [id number] DOB: 04-20-1925 Today's Date: 01/18/2016   History of Present Illness  82 y.o. female with medical history significant of Alzheimer's disease, hypertension, hyperlipidemia, anxiety, TIA, SAH 2015, CKD-III, dCHF, who presents with failure to thrive and  poor appetite  Clinical Impression  Patient evaluated by Physical Therapy with no further acute PT needs identified, as she is at baseline level of function. All education has been completed and the patient has no further questions. Worth having periodic therapy follow up at [facility] for potential splinting needs;  See below for any follow-up Physical Therapy or equipment needs. PT is signing off. Thank you for this referral.     Follow Up Recommendations SNF    Equipment Recommendations  None recommended by PT    Recommendations for Other Services Other (comment) (Consider [employer] for resting hand splints)     Precautions / Restrictions Precautions Precautions: Fall;Other (comment) Precaution Comments: Dysphagia precautions Restrictions Weight Bearing Restrictions: No      Mobility  Bed Mobility Overal bed mobility: Needs Assistance Bed Mobility: Rolling Rolling: Max assist;Total assist         General bed mobility comments: Max to total assist for rolling; Assisted with hygeine as pt had recently stooled; +2 total assist to slide up to [facility]; positioned in semi-seated position in prep for lunch  Transfers                 General transfer comment: Totally dependent for transfers PTA  Ambulation/Gait                Stairs            Wheelchair Mobility    Modified Rankin (Stroke Patients Only)       Balance                                             Pertinent Vitals/Pain Pain Assessment: Faces Faces Pain Scale: Hurts a little bit Pain Location: Minimal grimace with ROM of LEs, UEs, hands Pain  Descriptors / Indicators: Grimacing (slight) Pain Intervention(s): Monitored during session;Repositioned    Home Living Family/patient expects to be discharged to:: Skilled nursing facility                      Prior Function Level of Independence: Needs assistance         Comments: Per previous PT note, Ms. Crader was dependent in all aspects of mobitliy and ADLs at SNF; mechanical lift for OOB transfers     Hand Dominance        Extremity/Trunk Assessment   Upper Extremity Assessment: RUE deficits/detail;LUE deficits/detail RUE Deficits / Details: Noting some spontaneous active movement RUE; some limitiations shoulder with flex at or past 90 deg     LUE Deficits / Details: TEnding to keep hand in tight grip, but able to relax to open hand with gentle PROM; hemiparteic limb   Lower Extremity Assessment: RLE deficits/detail;LLE deficits/detail RLE Deficits / Details: Noting some resistence to passive motion hip and knee when compared to relative flaccidity of LLE LLE Deficits / Details: No resistence to PROM noted  Cervical / Trunk Assessment:  (generalized stiffness)  Communication   Communication: Receptive difficulties;Expressive difficulties (with history of dementia)  Cognition Arousal/Alertness: Awake/alert Behavior During Therapy: WFL for tasks assessed/performed (Calm and smiling) Overall  Cognitive Status: No family/caregiver present to determine baseline cognitive functioning (Advanced dementia)                      General Comments      Exercises        Assessment/Plan    PT Assessment All further PT needs can be met in the next venue of care  PT Diagnosis Generalized weakness   PT Problem List Decreased range of motion;Decreased strength;Decreased skin integrity;Impaired tone;Decreased cognition;Decreased coordination  PT Treatment Interventions     PT Goals (Current goals can be found in the Care Plan section) Acute Rehab PT  Goals Patient Stated Goal: Unable to state PT Goal Formulation: All assessment and education complete, DC therapy    Frequency     Barriers to discharge        Co-evaluation               End of Session   Activity Tolerance: Patient tolerated treatment well Patient left: in bed;with call bell/phone within reach;Other (comment) (bed in semi-chair in prep for lunch) Nurse Communication: Mobility status         Time: 7939-0300 PT Time Calculation (min) (ACUTE ONLY): 17 min   Charges:   PT Evaluation $PT Eval Moderate Complexity: 1 Procedure     PT G CodesRoney Marion Hamff 01/18/2016, 1:07 PM  Roney Marion, PT  Acute Rehabilitation Services Pager [phone] Office [phone]

## 2016-01-18 NOTE — Progress Notes (Signed)
OT NOTE  Pt has full range of bil hands and had hand fully open on arrival. Rn confirmed that during rotating assessment patient is able to open hands. Pt does not require splint for day time use. If patient is fisting during rest then a resting hand splint could be issued via [employer] or issued at SNF level upon return to facility. Pt should NOT wear splints during the day at this time due to ability to use hands to lift blankets as demonstrated during OT screen.  Please order [employer] resting hand if MD wishes to issue splints acutely.    Kammie Scioli, Brynn   OTR/L PageMateo Flowr: [phone] Office: [phone] .

## 2016-01-19 LAB — CULTURE, BLOOD (ROUTINE X 2)
CULTURE: NO GROWTH
CULTURE: NO GROWTH

## 2016-01-19 LAB — GLUCOSE, CAPILLARY
GLUCOSE-CAPILLARY: 109 mg/dL — AB (ref 65–99)
GLUCOSE-CAPILLARY: 132 mg/dL — AB (ref 65–99)
GLUCOSE-CAPILLARY: 90 mg/dL (ref 65–99)
GLUCOSE-CAPILLARY: 154 mg/dL — AB (ref 65–99)
Glucose-Capillary: 124 mg/dL — ABNORMAL HIGH (ref 65–99)

## 2016-01-19 LAB — BASIC METABOLIC PANEL
ANION GAP: 8 (ref 5–15)
BUN: 14 mg/dL (ref 6–20)
CALCIUM: 8.5 mg/dL — AB (ref 8.9–10.3)
CO2: 29 mmol/L (ref 22–32)
Chloride: 112 mmol/L — ABNORMAL HIGH (ref 101–111)
Creatinine, Ser: 1.05 mg/dL — ABNORMAL HIGH (ref 0.44–1.00)
GFR calc Af Amer: 53 mL/min — ABNORMAL LOW (ref >60)
GFR, EST NON AFRICAN AMERICAN: 46 mL/min — AB (ref >60)
GLUCOSE: 146 mg/dL — AB (ref 65–99)
Potassium: 4.2 mmol/L (ref 3.5–5.1)
SODIUM: 149 mmol/L — AB (ref 135–145)

## 2016-01-19 LAB — SODIUM
Sodium: 149 mmol/L — ABNORMAL HIGH (ref 135–145)
Sodium: 146 mmol/L — ABNORMAL HIGH (ref 135–145)

## 2016-01-19 LAB — PHOSPHORUS: Phosphorus: 2.6 mg/dL (ref 2.5–4.6)

## 2016-01-19 LAB — MAGNESIUM: Magnesium: 3 mg/dL — ABNORMAL HIGH (ref 1.7–2.4)

## 2016-01-19 NOTE — Care Management Important Message (Signed)
Important Message  Patient Details  Name: Bard HerbertRose P Jumper MRN: [id number] Date of Birth: 1924-05-23   Medicare Important Message Given:  Yes    Hitesh Fouche Stefan ChurchBratton 01/19/2016, 12:56 PM

## 2016-01-19 NOTE — Progress Notes (Signed)
[facility]  PROGRESS NOTE  KLARA STJAMES ZOX:[id number] DOB: 1924-07-27 DOA: 01/13/2016 PCP: Eloisa Northern, MD    Brief HPI:  82 y.o. female with medical history significant of Alzheimer's disease, hypertension, hyperlipidemia, anxiety, TIA, SAH 2015, CKD-III, dCHF, who presents with failure to thrive and  poor appetite.  Patient has dementia and possible worsening mental status, is unable to provide any accurate medical history, therefore, most of the history is obtained by discussing the case with ED physician, per EMS report, and with the nursing staff. History is very limited. It seems that pt has decreased oral intake and failure to thrive recently in SNF. X-ray of abd was done in SNF, which showed possible pneumoperitoneum today, therefore pt is transferred to ED for further evaluation and treatment.  pt was found to have sodium 177, chloride 1:30, potassium 3.8, bicarbonate 31, BUN 140, creatinine 3.33 (baseline creatinine 1.3), blood sugar by 59, lipase 357, troponin negative, WBC 9.4, hemoglobin 15.2, previously to 106, bradycardia, no tachypnea, oxygen saturation 94% on room air, pending urinalysis.     Assessment/Plan:    1. Hypernatremia Sodium has improved to 149, continued on D5W at 50 mL per hour. Monitor neurochecks Follow BMP in a.m.  2. Fecal impaction. From Severe dehydration Continue oral MiraLAX and Senokot. Continue to closely monitor.  3. Dysphagia. Speech therapy following. High likelihood of aspiration events in future. Palliative care consulted. Currently the patient will be on dysphagia type I diet. NG tube  removed as well as tube feeding  stopped. Monitor for evidence of dehydration.  4. Protein calorie malnutrition. Severe. Dietary consulted. Tube feeding was initiated via NG tube and has been discontinued as above. Magnesium level elevated. Will need to monitor.. Monitor for evidence of recurrent dehydration after stopping the tube  feeding.  4. AKI. Serum creatinine elevated on admission. Improving. Continue monitoring.  5. UTI.  Escherichia coli, growing in  urine culture pansensitive, given the patient cannot tolerate oral antibiotic I would continue with IV ceftriaxone. 5 days of antibiotic with last dose on 01/17/2016.  6. Sacral decubitus ulcer.  Currently has a Foley catheter and mattress.   Monitor.  7. Hypokalemia Replete.   DVT prophylaxis: Heparin Code Status: DO NOT RESUSCITATE Family Communication: no family at bedside  Disposition Plan: Skilled nursing facility   Consultants:  None   Procedures:  None   Antibiotics: Anti-infectives    Start     Dose/Rate Route Frequency Ordered Stop   01/13/16 2330  cefTRIAXone (ROCEPHIN) 1 g in dextrose 5 % 50 mL IVPB  Status:  Discontinued     1 g 100 mL/hr over 30 Minutes Intravenous Daily at bedtime 01/13/16 2313 01/18/16 1026   01/13/16 2330  cefTRIAXone (ROCEPHIN) 1 g in dextrose 5 % 50 mL IVPB  Status:  Discontinued     1 g 100 mL/hr over 30 Minutes Intravenous  Once 01/13/16 2317 01/13/16 2317      Subjective   Patient seen and examined, unable to provide history due to underlying dementia. Sodium is slowly improving. Today sodium is 149.  Objective    Objective: Vitals:   01/18/16 1600 01/18/16 2049 01/19/16 0404 01/19/16 0856  BP: 133/66 119/68 99/81 (!) 129/57  Pulse: 74 76 76 75  Resp: 16 16 16 16   Temp: 100.2 F (37.9 C) 98.3 F (36.8 C) 98.9 F (37.2 C) 98.1 F (36.7 C)  TempSrc: Axillary Axillary Axillary Axillary  SpO2: 95% 94% 96% 94%  Weight:  54.5 kg (120 lb 2.4  oz)    Height:        Intake/Output Summary (Last 24 hours) at 01/19/16 1333 Last data filed at 01/19/16 1300  Gross per 24 hour  Intake              980 ml  Output             1060 ml  Net              -80 ml   Filed Weights   01/17/16 2043 01/18/16 0452 01/18/16 2049  Weight: 54.4 kg (120 lb) 54.4 kg (119 lb 14.9 oz) 54.5 kg (120 lb 2.4  oz)    Examination:  General exam: Appears calm and comfortable  Respiratory system: Clear to auscultation. Respiratory effort normal. Cardiovascular system: S1 & S2 heard, RRR. No JVD, murmurs, rubs, gallops or clicks. No pedal edema. Gastrointestinal system: Abdomen is nondistended, soft and nontender. No organomegaly or masses felt. Normal bowel sounds heard. Central nervous system: Alert. No focal neurological deficits. Extremities: Symmetric 5 x 5 power. As you are  Data Reviewed: I have personally reviewed following labs and imaging studies Basic Metabolic Panel:  Recent Labs Lab 01/15/16 1237  01/16/16 0446 01/16/16 0927  01/17/16 1610  01/18/16 0026 01/18/16 0348 01/18/16 1550 01/18/16 2257 01/19/16 0422  NA 164*  < > 160* 157*  158*  < > 155*  < > 150* 150* 145 149* 149*  K 4.7  --   --  3.3*  --  3.5  --   --  3.0*  --   --  4.2  CL 129*  --   --  119*  --  120*  --   --  116*  --   --  112*  CO2 27  --   --  27  --  26  --   --  28  --   --  29  GLUCOSE 104*  --   --  205*  --  123*  --   --  113*  --   --  146*  BUN 87*  --   --  57*  --  40*  --   --  25*  --   --  14  CREATININE 2.10*  --   --  1.69*  --  1.47*  --   --  1.09*  --   --  1.05*  CALCIUM 8.3*  --   --  8.2*  --  8.2*  --   --  8.0*  --   --  8.5*  MG  --   --  3.0* 3.1*  --  3.3*  --   --  3.0*  --   --  3.0*  PHOS  --   --  3.4  --   --  3.3  --   --  3.4  --   --  2.6  < > = values in this interval not displayed. Liver Function Tests:  Recent Labs Lab 01/13/16 1808 01/15/16 1237  AST 36 70*  ALT 42 48  ALKPHOS 91 80  BILITOT 1.5* 2.4*  PROT 7.4 6.1*  ALBUMIN 3.4* 2.8*    Recent Labs Lab 01/13/16 1808  LIPASE 357*   No results for input(s): AMMONIA in the last 168 hours. CBC:  Recent Labs Lab 01/13/16 1808 01/14/16 0419 01/16/16 0927 01/17/16 0632  WBC 9.4 7.9 6.2 8.8  NEUTROABS 6.8  --  3.3  --   HGB 15.2* 13.6  12.0 12.3  HCT 40.9 47.9* 41.4 41.3  MCV 106.8* 105.3*  101.2* 100.0  PLT 106* 139* 125* 114*   Cardiac Enzymes: No results for input(s): CKTOTAL, CKMB, CKMBINDEX, TROPONINI in the last 168 hours. BNP (last 3 results) No results for input(s): BNP in the last 8760 hours.  ProBNP (last 3 results) No results for input(s): PROBNP in the last 8760 hours.  CBG:  Recent Labs Lab 01/18/16 2043 01/19/16 0000 01/19/16 0401 01/19/16 0801 01/19/16 1156  GLUCAP 165* 152* 124* 109* 132*    Recent Results (from the past 240 hour(s))  Urine culture     Status: Abnormal   Collection Time: 01/13/16  1:29 AM  Result Value Ref Range Status   Specimen Description URINE, CATHETERIZED  Final   Special Requests NONE  Final   Culture >=100,000 COLONIES/mL ESCHERICHIA COLI (A)  Final   Report Status 01/16/2016 FINAL  Final   Organism ID, Bacteria ESCHERICHIA COLI (A)  Final      Susceptibility   Escherichia coli - MIC*    AMPICILLIN 4 SENSITIVE Sensitive     CEFAZOLIN <=4 SENSITIVE Sensitive     CEFTRIAXONE <=1 SENSITIVE Sensitive     CIPROFLOXACIN 0.5 SENSITIVE Sensitive     GENTAMICIN <=1 SENSITIVE Sensitive     IMIPENEM <=0.25 SENSITIVE Sensitive     NITROFURANTOIN <=16 SENSITIVE Sensitive     TRIMETH/SULFA <=20 SENSITIVE Sensitive     AMPICILLIN/SULBACTAM 4 SENSITIVE Sensitive     PIP/TAZO <=4 SENSITIVE Sensitive     Extended ESBL NEGATIVE Sensitive     * >=100,000 COLONIES/mL ESCHERICHIA COLI  MRSA PCR Screening     Status: None   Collection Time: 01/13/16 11:26 PM  Result Value Ref Range Status   MRSA by PCR NEGATIVE NEGATIVE Final    Comment:        The GeneXpert MRSA Assay (FDA approved for NASAL specimens only), is one component of a comprehensive MRSA colonization surveillance program. It is not intended to diagnose MRSA infection nor to guide or monitor treatment for MRSA infections.   Blood culture (routine x 2)     Status: None (Preliminary result)   Collection Time: 01/14/16 12:09 AM  Result Value Ref Range Status    Specimen Description BLOOD LEFT HAND  Final   Special Requests IN PEDIATRIC BOTTLE 2ML  Final   Culture NO GROWTH 4 DAYS  Final   Report Status PENDING  Incomplete  Blood culture (routine x 2)     Status: None (Preliminary result)   Collection Time: 01/14/16 12:19 AM  Result Value Ref Range Status   Specimen Description BLOOD RIGHT ARM  Final   Special Requests BOTTLES DRAWN AEROBIC AND ANAEROBIC 5ML  Final   Culture NO GROWTH 4 DAYS  Final   Report Status PENDING  Incomplete     Studies: No results found.  Scheduled Meds: . chlorhexidine  15 mL Mouth Rinse BID  . collagenase   Topical Daily  . feeding supplement (ENSURE ENLIVE)  237 mL Oral TID BM  . heparin  5,000 Units Subcutaneous Q8H  . insulin aspart  0-9 Units Subcutaneous Q4H  . mouth rinse  15 mL Mouth Rinse q12n4p  . polyethylene glycol  17 g Oral Daily  . senna-docusate  1 tablet Oral BID  . sodium chloride flush  3 mL Intravenous Q12H   Continuous Infusions: . dextrose 50 mL/hr at 01/17/16 2100       Time spent: 25 min    Tadan Shill S  [facility] Pager [phone]. If 7PM-7AM, please contact night-coverage at [email or web address], Office  [phone]  password [email or web address] 01/19/2016, 1:33 PM  LOS: 6 days

## 2016-01-20 DIAGNOSIS — E870 Hyperosmolality and hypernatremia: Principal | ICD-10-CM

## 2016-01-20 DIAGNOSIS — K5901 Slow transit constipation: Secondary | ICD-10-CM

## 2016-01-20 DIAGNOSIS — K5900 Constipation, unspecified: Secondary | ICD-10-CM

## 2016-01-20 DIAGNOSIS — I10 Essential (primary) hypertension: Secondary | ICD-10-CM

## 2016-01-20 LAB — MAGNESIUM: Magnesium: 2.7 mg/dL — ABNORMAL HIGH (ref 1.7–2.4)

## 2016-01-20 LAB — BASIC METABOLIC PANEL
Anion gap: 6 (ref 5–15)
BUN: 7 mg/dL (ref 6–20)
CO2: 26 mmol/L (ref 22–32)
CREATININE: 0.78 mg/dL (ref 0.44–1.00)
Calcium: 8 mg/dL — ABNORMAL LOW (ref 8.9–10.3)
Chloride: 112 mmol/L — ABNORMAL HIGH (ref 101–111)
GFR calc Af Amer: >60 mL/min (ref >60)
GLUCOSE: 89 mg/dL (ref 65–99)
POTASSIUM: 3.3 mmol/L — AB (ref 3.5–5.1)
SODIUM: 144 mmol/L (ref 135–145)

## 2016-01-20 LAB — GLUCOSE, CAPILLARY
GLUCOSE-CAPILLARY: 70 mg/dL (ref 65–99)
GLUCOSE-CAPILLARY: 91 mg/dL (ref 65–99)
Glucose-Capillary: 126 mg/dL — ABNORMAL HIGH (ref 65–99)
Glucose-Capillary: 130 mg/dL — ABNORMAL HIGH (ref 65–99)

## 2016-01-20 LAB — SODIUM: Sodium: 146 mmol/L — ABNORMAL HIGH (ref 135–145)

## 2016-01-20 LAB — PHOSPHORUS: Phosphorus: 2.9 mg/dL (ref 2.5–4.6)

## 2016-01-20 MED ORDER — INFLUENZA VAC SPLIT QUAD 0.5 ML IM SUSY: 0.5000 mL | PREFILLED_SYRINGE | INTRAMUSCULAR | Status: DC

## 2016-01-20 MED ORDER — ENSURE ENLIVE PO LIQD: 237.0000 mL | Freq: Three times a day (TID) | ORAL | 12 refills | Status: AC

## 2016-01-20 MED ORDER — COLLAGENASE 250 UNIT/GM EX OINT: TOPICAL_OINTMENT | Freq: Every day | CUTANEOUS | 0 refills | Status: AC

## 2016-01-20 MED ORDER — ACETAMINOPHEN 325 MG PO TABS: 650.0000 mg | ORAL_TABLET | Freq: Four times a day (QID) | ORAL | 1 refills | Status: AC | PRN

## 2016-01-20 MED ORDER — POTASSIUM CHLORIDE CRYS ER 20 MEQ PO TBCR
40.0000 meq | EXTENDED_RELEASE_TABLET | Freq: Once | ORAL | Status: AC
  Administered 2016-01-20: 40 meq via ORAL
  Filled 2016-01-20: qty 2

## 2016-01-20 MED ORDER — BISACODYL 5 MG PO TBEC: 5.0000 mg | DELAYED_RELEASE_TABLET | Freq: Every day | ORAL | 0 refills | Status: AC | PRN

## 2016-01-20 MED ORDER — POTASSIUM CHLORIDE CRYS ER 20 MEQ PO TBCR: 40.0000 meq | EXTENDED_RELEASE_TABLET | Freq: Every day | ORAL | 0 refills | Status: AC

## 2016-01-20 MED ORDER — CLONIDINE HCL 0.1 MG PO TABS: 0.1000 mg | ORAL_TABLET | Freq: Every day | ORAL | 11 refills | Status: AC

## 2016-01-20 NOTE — Progress Notes (Signed)
No charge note:  Noted results of swallow evaluation. Patient on dysphagia 1 diet. Attempted to call patient's son. He was at work and could not talk to me. He will call me tomorrow 9/13 for update on patient and disposition. At this point I think SNF with Hospice following is appropriate.   Ocie BobKasie Elajah Kunsman, AGNP-C Palliative Medicine  Please call Palliative Medicine team phone with any questions [phone]. For individual providers please see AMION.

## 2016-01-20 NOTE — Discharge Summary (Signed)
Physician Discharge Summary  Wanda Hughes MRN: [id number] DOB/AGE: [birth year] 82 y.o.  PCP: Garwin Brothers, MD   Admit date: 01/13/2016 Discharge date: 01/20/2016  Discharge Diagnoses:    Principal Problem:   Hypernatremia Active Problems:   Essential hypertension   Hyperlipemia   Dementia in Alzheimer's disease   Constipation   Acute renal failure superimposed on stage 3 chronic kidney disease (HCC)   Protein-calorie malnutrition, severe (HCC)   Hyperchloremia   Failure to thrive (0-17)   Chronic diastolic (congestive) heart failure (HCC)   UTI (lower urinary tract infection)   Pressure ulcer   Palliative care encounter   Hypokalemia    Follow-up recommendations Follow-up with PCP in 3-5 days , including all  additional recommended appointments as below Follow-up CBC, CMP in 3-5 days Recommend palliative care to continue following the patient in SNF DO NOT RESUSCITATE DO NOT INTUBATE no pressors No PEG tube Continue to try to treat the treatable such as antibiotics IV fluids and disimpaction Patient currently on dysphagia 1 diet with honey thick liquids     Current Discharge Medication List    START taking these medications   Details  acetaminophen (TYLENOL) 325 MG tablet Take 2 tablets (650 mg total) by mouth every 6 (six) hours as needed for mild pain (or Fever >/= 101). Qty: 30 tablet, Refills: 1    bisacodyl (BISACODYL) 5 MG EC tablet Take 1 tablet (5 mg total) by mouth daily as needed for moderate constipation. Qty: 30 tablet, Refills: 0    collagenase (SANTYL) ointment Apply topically daily. Qty: 15 g, Refills: 0    feeding supplement, ENSURE ENLIVE, (ENSURE ENLIVE) LIQD Take 237 mLs by mouth 3 (three) times daily between meals. Qty: 237 mL, Refills: 12      CONTINUE these medications which have CHANGED   Details  cloNIDine (CATAPRES) 0.1 MG tablet Take 1 tablet (0.1 mg total) by mouth daily. Hold for BP <401 systolic or <027 diastolic Qty: 60 tablet,  Refills: 11    potassium chloride SA (K-DUR,KLOR-CON) 20 MEQ tablet Take 2 tablets (40 mEq total) by mouth daily. Qty: 30 tablet, Refills: 0      CONTINUE these medications which have NOT CHANGED   Details  amLODipine (NORVASC) 5 MG tablet Take 5 mg by mouth every evening.     cetirizine (ZYRTEC) 10 MG tablet Take 10 mg by mouth every evening.    ezetimibe (ZETIA) 10 MG tablet Take 10 mg by mouth at bedtime.    labetalol (NORMODYNE) 300 MG tablet Take 300 mg by mouth 2 (two) times daily.    latanoprost (XALATAN) 0.005 % ophthalmic solution Place 1 drop into both eyes at bedtime.    Memantine HCl-Donepezil HCl (NAMZARIC) 28-10 MG CP24 Take 1 capsule by mouth every evening.    polyethylene glycol (MIRALAX / GLYCOLAX) packet Take 17 g by mouth daily.    polyvinyl alcohol (LIQUIFILM TEARS) 1.4 % ophthalmic solution Place 1 drop into both eyes daily as needed for dry eyes.    senna (SENOKOT) 8.6 MG TABS tablet Take 2 tablets by mouth at bedtime.     traZODone (DESYREL) 50 MG tablet Take 25 mg by mouth at bedtime. For depression      STOP taking these medications     furosemide (LASIX) 40 MG tablet      LORazepam (ATIVAN) 2 MG/ML injection          Discharge Condition:Stable Discharge Instructions Get Medicines reviewed and adjusted: Please take all your medications with  you for your next visit with your Primary MD  Please request your Primary MD to go over all hospital tests and procedure/radiological results at the follow up, please ask your Primary MD to get all Hospital records sent to his/her office.  If you experience worsening of your admission symptoms, develop shortness of breath, life threatening emergency, suicidal or homicidal thoughts you must seek medical attention immediately by calling 911 or calling your MD immediately if symptoms less severe.  You must read complete instructions/literature along with all the possible adverse reactions/side effects for all  the Medicines you take and that have been prescribed to you. Take any new Medicines after you have completely understood and accpet all the possible adverse reactions/side effects.   Do not drive when taking Pain medications.   Do not take more than prescribed Pain, Sleep and Anxiety Medications  Special Instructions: If you have smoked or chewed Tobacco in the last 2 yrs please stop smoking, stop any regular Alcohol and or any Recreational drug use.  Wear Seat belts while driving.  Please note  You were cared for by a hospitalist during your hospital stay. Once you are discharged, your primary care physician will handle any further medical issues. Please note that NO REFILLS for any discharge medications will be authorized once you are discharged, as it is imperative that you return to your primary care physician (or establish a relationship with a primary care physician if you do not have one) for your aftercare needs so that they can reassess your need for medications and monitor your lab values.     Allergies  Allergen Reactions  . Ace Inhibitors Other (See Comments)    Unknown-from nursing home MAR      Disposition: 03-Skilled Nursing Facility   Consults:  Palliative care      Significant Diagnostic Studies:  Ct Abdomen Pelvis Wo Contrast  Result Date: 01/13/2016 CLINICAL DATA:  82 year old female with decreased appetite and rectal fecal impaction EXAM: CT ABDOMEN AND PELVIS WITHOUT CONTRAST TECHNIQUE: Multidetector CT imaging of the abdomen and pelvis was performed following the standard protocol without IV contrast. COMPARISON:  Acute abdominal series obtained earlier today ; CT scan of the abdomen and pelvis 07/23/2009 FINDINGS: Lower chest: Nonspecific patchy airspace opacity in the visualized left lower lobe may represent infiltrate or atelectasis. Extensive coronary artery calcifications. The heart is normal in size. No pericardial effusion. Unremarkable visualized  distal thoracic esophagus. Hepatobiliary: Normal hepatic contour and morphology. No discrete hepatic lesions. Normal appearance of the gallbladder. No intra or extrahepatic biliary ductal dilatation. Pancreas: No mass or inflammatory process identified on this un-enhanced exam. Spleen: Within normal limits in size. Adrenals/Urinary Tract: No evidence of urolithiasis or hydronephrosis. No definite mass visualized on this un-enhanced exam. Stomach/Bowel: Massive dilatation of the rectum which is impacted with a 10 x 12 x 13 cm stool ball. No evidence of a bowel obstruction. No evidence of free air. Vascular/Lymphatic: Extensive atherosclerotic vascular calcifications. Mild ectasia of the infrarenal abdominal aorta with a maximal diameter of 2.7 cm. No suspicious lymphadenopathy. Reproductive: Calcified degenerated uterine fibroids. Other: None Musculoskeletal: No acute fracture or aggressive appearing lytic or blastic osseous lesion. Advanced lower lumbar degenerative disc disease with levoconvex scoliosis and multilevel facet arthropathy. IMPRESSION: 1. As noted on the prior acute abdominal series, there is no evidence of free air. 2. Massively distended rectum impacted with a 10 x 12 x 13 cm stool ball. 3. Nonspecific patchy airspace opacity in the left lower lobe may represent  pneumonia or atelectasis. 4. Extensive atherosclerotic vascular calcifications. 5.  Aortic Atherosclerosis (ICD10-170.0) 6. Advanced multilevel degenerative disc disease and lower lumbar facet arthropathy. Electronically Signed   By: Jacqulynn Cadet M.D.   On: 01/13/2016 20:19   Dg Abd 1 View  Result Date: 01/14/2016 CLINICAL DATA:  Feeding tube placement. EXAM: ABDOMEN - 1 VIEW COMPARISON:  CT 01/13/2016. FINDINGS: Feeding tube noted with tip projected over the duodenum. Again noted is prominent distention of the rectum with stool. Fecal impaction most likely present. Distended loops of small and large bowel noted. Interposition of  colon under the hemidiaphragm again noted. Degenerative changes lumbar spine and both hips. IMPRESSION: 1. Feeding tube noted with tip projected over the duodenum. 2. Findings again noted consistent with severe rectal fecal impaction . Distended loops of small and large bowel noted. Electronically Signed   By: Marcello Moores  Register   On: 01/14/2016 09:14   Dg Abdomen Acute W/chest  Result Date: 01/13/2016 CLINICAL DATA:  Abdominal pain, possible bowel perforation EXAM: DG ABDOMEN ACUTE W/ 1V CHEST COMPARISON:  05/28/2010 FINDINGS: Cardiac shadow is stable. Aortic calcifications are again noted. The lungs are well aerated bilaterally. Situation mediastinal markings is related to patient rotation. Scattered large and small bowel gas is noted. Changes consistent with rectal impaction are seen. No free air is noted. Degenerative changes of lumbar spine are seen. IMPRESSION: Changes consistent with rectal impaction. No definitive free intraperitoneal air is seen. Electronically Signed   By: Inez Catalina M.D.   On: 01/13/2016 19:13   Dg Abd Portable 1v  Result Date: 01/16/2016 CLINICAL DATA:  Feeding tube placement EXAM: PORTABLE ABDOMEN - 1 VIEW COMPARISON:  None. FINDINGS: There is normal small bowel gas pattern. There is NG feeding tube with tip in distal duodenum. IMPRESSION: NG feeding tube with tip in distal duodenum. Electronically Signed   By: Lahoma Crocker M.D.   On: 01/16/2016 20:28   Dg Abd Portable 1v  Result Date: 01/16/2016 CLINICAL DATA:  Fecal impaction. EXAM: PORTABLE ABDOMEN - 1 VIEW COMPARISON:  01/15/2016 FINDINGS: The bowel gas pattern is nonobstructive. Again seen is large amount of formed stool within the rectum consistent with fecal impaction. No evidence of organomegaly. IMPRESSION: Constipation with evidence of fecal impaction. Nonobstructive bowel gas pattern. Electronically Signed   By: Fidela Salisbury M.D.   On: 01/16/2016 20:23   Dg Abd Portable 1v  Result Date: 01/15/2016 CLINICAL  DATA:  Constipation. EXAM: PORTABLE ABDOMEN - 1 VIEW COMPARISON:  01/14/2016. FINDINGS: Large fecal impaction in the rectum is redemonstrated. Enteric tube tip remains within the proximal duodenum. Moderate stool burden elsewhere in the colon appears stable. IMPRESSION: Stable abdominal radiograph.  Fecal impaction. Electronically Signed   By: Staci Righter M.D.   On: 01/15/2016 08:02       Filed Weights   01/18/16 0452 01/18/16 2049 01/19/16 2058  Weight: 54.4 kg (119 lb 14.9 oz) 54.5 kg (120 lb 2.4 oz) 54.4 kg (119 lb 14.9 oz)     Microbiology: Recent Results (from the past 240 hour(s))  Urine culture     Status: Abnormal   Collection Time: 01/13/16  1:29 AM  Result Value Ref Range Status   Specimen Description URINE, CATHETERIZED  Final   Special Requests NONE  Final   Culture >=100,000 COLONIES/mL ESCHERICHIA COLI (A)  Final   Report Status 01/16/2016 FINAL  Final   Organism ID, Bacteria ESCHERICHIA COLI (A)  Final      Susceptibility   Escherichia coli - MIC*  AMPICILLIN 4 SENSITIVE Sensitive     CEFAZOLIN <=4 SENSITIVE Sensitive     CEFTRIAXONE <=1 SENSITIVE Sensitive     CIPROFLOXACIN 0.5 SENSITIVE Sensitive     GENTAMICIN <=1 SENSITIVE Sensitive     IMIPENEM <=0.25 SENSITIVE Sensitive     NITROFURANTOIN <=16 SENSITIVE Sensitive     TRIMETH/SULFA <=20 SENSITIVE Sensitive     AMPICILLIN/SULBACTAM 4 SENSITIVE Sensitive     PIP/TAZO <=4 SENSITIVE Sensitive     Extended ESBL NEGATIVE Sensitive     * >=100,000 COLONIES/mL ESCHERICHIA COLI  MRSA PCR Screening     Status: None   Collection Time: 01/13/16 11:26 PM  Result Value Ref Range Status   MRSA by PCR NEGATIVE NEGATIVE Final    Comment:        The GeneXpert MRSA Assay (FDA approved for NASAL specimens only), is one component of a comprehensive MRSA colonization surveillance program. It is not intended to diagnose MRSA infection nor to guide or monitor treatment for MRSA infections.   Blood culture (routine x  2)     Status: None   Collection Time: 01/14/16 12:09 AM  Result Value Ref Range Status   Specimen Description BLOOD LEFT HAND  Final   Special Requests IN PEDIATRIC BOTTLE 2ML  Final   Culture NO GROWTH 5 DAYS  Final   Report Status 01/19/2016 FINAL  Final  Blood culture (routine x 2)     Status: None   Collection Time: 01/14/16 12:19 AM  Result Value Ref Range Status   Specimen Description BLOOD RIGHT ARM  Final   Special Requests BOTTLES DRAWN AEROBIC AND ANAEROBIC 5ML  Final   Culture NO GROWTH 5 DAYS  Final   Report Status 01/19/2016 FINAL  Final       Blood Culture    Component Value Date/Time   SDES BLOOD RIGHT ARM 01/14/2016 0019   SPECREQUEST BOTTLES DRAWN AEROBIC AND ANAEROBIC 5ML 01/14/2016 0019   CULT NO GROWTH 5 DAYS 01/14/2016 0019   REPTSTATUS 01/19/2016 FINAL 01/14/2016 0019      Labs: Results for orders placed or performed during the hospital encounter of 01/13/16 (from the past 48 hour(s))  Glucose, capillary     Status: Abnormal   Collection Time: 01/18/16  8:34 AM  Result Value Ref Range   Glucose-Capillary 141 (H) 65 - 99 mg/dL  Glucose, capillary     Status: Abnormal   Collection Time: 01/18/16 12:04 PM  Result Value Ref Range   Glucose-Capillary 144 (H) 65 - 99 mg/dL  Sodium     Status: None   Collection Time: 01/18/16  3:50 PM  Result Value Ref Range   Sodium 145 135 - 145 mmol/L  Glucose, capillary     Status: Abnormal   Collection Time: 01/18/16  4:28 PM  Result Value Ref Range   Glucose-Capillary 114 (H) 65 - 99 mg/dL  Glucose, capillary     Status: Abnormal   Collection Time: 01/18/16  8:43 PM  Result Value Ref Range   Glucose-Capillary 165 (H) 65 - 99 mg/dL  Sodium     Status: Abnormal   Collection Time: 01/18/16 10:57 PM  Result Value Ref Range   Sodium 149 (H) 135 - 145 mmol/L  Glucose, capillary     Status: Abnormal   Collection Time: 01/19/16 12:00 AM  Result Value Ref Range   Glucose-Capillary 152 (H) 65 - 99 mg/dL  Glucose,  capillary     Status: Abnormal   Collection Time: 01/19/16  4:01 AM  Result Value Ref Range   Glucose-Capillary 124 (H) 65 - 99 mg/dL  Magnesium     Status: Abnormal   Collection Time: 01/19/16  4:22 AM  Result Value Ref Range   Magnesium 3.0 (H) 1.7 - 2.4 mg/dL  Phosphorus     Status: None   Collection Time: 01/19/16  4:22 AM  Result Value Ref Range   Phosphorus 2.6 2.5 - 4.6 mg/dL  Basic metabolic panel     Status: Abnormal   Collection Time: 01/19/16  4:22 AM  Result Value Ref Range   Sodium 149 (H) 135 - 145 mmol/L   Potassium 4.2 3.5 - 5.1 mmol/L    Comment: DELTA CHECK NOTED   Chloride 112 (H) 101 - 111 mmol/L   CO2 29 22 - 32 mmol/L   Glucose, Bld 146 (H) 65 - 99 mg/dL   BUN 14 6 - 20 mg/dL   Creatinine, Ser 1.05 (H) 0.44 - 1.00 mg/dL   Calcium 8.5 (L) 8.9 - 10.3 mg/dL   GFR calc non Af Amer 46 (L) >60 mL/min   GFR calc Af Amer 53 (L) >60 mL/min    Comment: (NOTE) The eGFR has been calculated using the CKD EPI equation. This calculation has not been validated in all clinical situations. eGFR's persistently <60 mL/min signify possible Chronic Kidney Disease.    Anion gap 8 5 - 15  Glucose, capillary     Status: Abnormal   Collection Time: 01/19/16  8:01 AM  Result Value Ref Range   Glucose-Capillary 109 (H) 65 - 99 mg/dL  Glucose, capillary     Status: Abnormal   Collection Time: 01/19/16 11:56 AM  Result Value Ref Range   Glucose-Capillary 132 (H) 65 - 99 mg/dL  Sodium     Status: Abnormal   Collection Time: 01/19/16  3:28 PM  Result Value Ref Range   Sodium 149 (H) 135 - 145 mmol/L  Glucose, capillary     Status: None   Collection Time: 01/19/16  4:52 PM  Result Value Ref Range   Glucose-Capillary 90 65 - 99 mg/dL  Glucose, capillary     Status: Abnormal   Collection Time: 01/19/16  8:51 PM  Result Value Ref Range   Glucose-Capillary 154 (H) 65 - 99 mg/dL  Sodium     Status: Abnormal   Collection Time: 01/19/16 10:17 PM  Result Value Ref Range   Sodium  146 (H) 135 - 145 mmol/L  Glucose, capillary     Status: Abnormal   Collection Time: 01/20/16 12:51 AM  Result Value Ref Range   Glucose-Capillary 126 (H) 65 - 99 mg/dL  Magnesium     Status: Abnormal   Collection Time: 01/20/16  5:09 AM  Result Value Ref Range   Magnesium 2.7 (H) 1.7 - 2.4 mg/dL  Phosphorus     Status: None   Collection Time: 01/20/16  5:09 AM  Result Value Ref Range   Phosphorus 2.9 2.5 - 4.6 mg/dL  Basic metabolic panel     Status: Abnormal   Collection Time: 01/20/16  5:09 AM  Result Value Ref Range   Sodium 144 135 - 145 mmol/L   Potassium 3.3 (L) 3.5 - 5.1 mmol/L    Comment: DELTA CHECK NOTED   Chloride 112 (H) 101 - 111 mmol/L   CO2 26 22 - 32 mmol/L   Glucose, Bld 89 65 - 99 mg/dL   BUN 7 6 - 20 mg/dL   Creatinine, Ser 0.78 0.44 - 1.00 mg/dL  Calcium 8.0 (L) 8.9 - 10.3 mg/dL   GFR calc non Af Amer >60 >60 mL/min   GFR calc Af Amer >60 >60 mL/min    Comment: (NOTE) The eGFR has been calculated using the CKD EPI equation. This calculation has not been validated in all clinical situations. eGFR's persistently <60 mL/min signify possible Chronic Kidney Disease.    Anion gap 6 5 - 15  Glucose, capillary     Status: None   Collection Time: 01/20/16  5:15 AM  Result Value Ref Range   Glucose-Capillary 70 65 - 99 mg/dL  Glucose, capillary     Status: None   Collection Time: 01/20/16  7:51 AM  Result Value Ref Range   Glucose-Capillary 91 65 - 99 mg/dL     Lipid Panel     Component Value Date/Time   CHOL (H) 09/25/2008 0030    303        ATP III CLASSIFICATION:  <200     mg/dL   Desirable  200-239  mg/dL   Borderline High  >=240    mg/dL   High          TRIG 150 (H) 09/25/2008 0030   HDL 47 09/25/2008 0030   CHOLHDL 6.4 09/25/2008 0030   VLDL 30 09/25/2008 0030   LDLCALC (H) 09/25/2008 0030    226        Total Cholesterol/HDL:CHD Risk Coronary Heart Disease Risk Table                     Men   Women  1/2 Average Risk   3.4   3.3   Average Risk       5.0   4.4  2 X Average Risk   9.6   7.1  3 X Average Risk  23.4   11.0        Use the calculated Patient Ratio above and the CHD Risk Table to determine the patient's CHD Risk.        ATP III CLASSIFICATION (LDL):  <100     mg/dL   Optimal  100-129  mg/dL   Near or Above                    Optimal  130-159  mg/dL   Borderline  160-189  mg/dL   High  >190     mg/dL   Very High     Lab Results  Component Value Date   HGBA1C  09/25/2008    6.1 (NOTE) The ADA recommends the following therapeutic goal for glycemic control related to Hgb A1c measurement: Goal of therapy: <6.5 Hgb A1c  Reference: American Diabetes Association: Clinical Practice Recommendations 2010, Diabetes Care, 2010, 33: (Suppl  1).   HGBA1C  12/22/2006    6.1 (NOTE)   The ADA recommends the following therapeutic goals for glycemic   control related to Hgb A1C measurement:   Goal of Therapy:   < 7.0% Hgb A1C   Action Suggested:  > 8.0% Hgb A1C   Ref:  Diabetes Care, 22, Suppl. 1, 1999        HPI   82 y.o. female  with past medical history of Severe Alzheimer's disease, hypertension, hyperlipidemia, anxiety, TIA, subarachnoid hemorrhage 2015, chronic kidney disease stage III, diastolic heart failure admitted on 01/13/2016 with failure to thrive and poor appetite. X-ray of the abdomen was done and skilled nursing facility which showed a possible pneumoperitoneum and patient was transferred to the emergency room.  Patient is nonverbal upon arrival. She was found to have a sodium level of 177 creatinine of 3.33 (a sign creatinine 1.3), blood sugar 59, lipase 357, bradycardia,. Patient had a severe impaction per CT of the abdomen and pelvis. She also had a urinary tract infection.. Tube feedings via NG tube was started until patient's goals of care could be ascertained as well as input from speech therapy   HOSPITAL COURSE:   1. Hypernatremia , Sodium improved from 177>144 Unsure the patient will be  able to maintain sodium in the normal range on her own due to poor oral intake Treated with D5W  If unable to establish and maintain by mouth intake, consider transition to full comfort care  2. Fecal impaction. From Severe dehydration Continue oral MiraLAX and Senokot. Continue to closely monitor.  3. Dysphagia. Speech therapy  consulted 9/10 Continue dysphagia 1 diet Palliative care consulted. Currently the patient will be on dysphagia type I diet. NG tube  removed as well as tube feeding  stopped. Monitor for evidence of dehydration.  4. Protein calorie malnutrition. Severe. Dietary consulted. Tube feeding was initiated via NG tube and has been discontinued as above. Magnesium level elevated. Will need to monitor.. Monitor for evidence of recurrent dehydration after stopping the tube feeding.  4. AKI. Serum creatinine elevated on admission. Now 0.78 on the day of discharge    5. UTI.  Escherichia coli, growing in  urine culture pansensitive, given the patient cannot tolerate oral antibiotic I would continue with IV ceftriaxone. 5 days of antibiotic with last dose on 01/17/2016.  6. Sacral decubitus ulcer.  Currently has a Foley catheter and mattress.   Monitor.  7. Hypokalemia Repleted   Discharge Exam:  Blood pressure 124/66, pulse 71, temperature 98.8 F (37.1 C), temperature source Axillary, resp. rate 15, height '5\' 4"'$  (1.626 m), weight 54.4 kg (119 lb 14.9 oz), SpO2 99 %.  Constitutional:  frail elderly female  HENT:  Head: Normocephalic and atraumatic.  Neck: Normal range of motion.  Cardiovascular:  irrg  Pulmonary/Chest: Effort normal.  Genitourinary:  Genitourinary Comments: foley  Neurological:  Opens her eyes to voice; can track you in the room. Non-verbal now and son reports she is non-verbal at baseline     Follow-up [place], MD. Schedule an appointment as soon as possible for a visit today.   Specialty:  Internal  Medicine Why:  Hospital follow-up Contact information: [address] [place] [address] [phone]           Signed: Reyne Dumas 01/20/2016, 8:19 AM        Time spent >45 mins

## 2016-01-20 NOTE — Clinical Social Work Note (Signed)
Patient will discharge today per MD order. [facility] RN to call report prior to transportation to: [phone] Transportation: [facility]  CSW sent discharge summary to SNF for review.  Detailed message left for [name] regarding discharge today.  Of note, CSW yesterday spoke with Mr [name] at length re: possible dc today.  Mr [name] was agreeable.    Vickii PennaGina Kaaliyah Kita, MSW, LCSW  [phone]  Licensed Clinical Social Worker

## 2016-01-20 NOTE — Progress Notes (Signed)
Wanda Hughes to be D/C'd Skilled nursing facility per MD order.  Discussed prescriptions and follow up appointments with the patient. Prescriptions given to patient, medication list explained in detail. Pt verbalized understanding.    Medication List    STOP taking these medications   furosemide 40 MG tablet Commonly known as:  LASIX   LORazepam 2 MG/ML injection Commonly known as:  ATIVAN     TAKE these medications   acetaminophen 325 MG tablet Commonly known as:  TYLENOL Take 2 tablets (650 mg total) by mouth every 6 (six) hours as needed for mild pain (or Fever >/= 101).   amLODipine 5 MG tablet Commonly known as:  NORVASC Take 5 mg by mouth every evening.   bisacodyl 5 MG EC tablet Commonly known as:  bisacodyl Take 1 tablet (5 mg total) by mouth daily as needed for moderate constipation.   cetirizine 10 MG tablet Commonly known as:  ZYRTEC Take 10 mg by mouth every evening.   cloNIDine 0.1 MG tablet Commonly known as:  CATAPRES Take 1 tablet (0.1 mg total) by mouth daily. Hold for BP <160 systolic or <100 diastolic   collagenase ointment Commonly known as:  SANTYL Apply topically daily.   ezetimibe 10 MG tablet Commonly known as:  ZETIA Take 10 mg by mouth at bedtime.   feeding supplement (ENSURE ENLIVE) Liqd Take 237 mLs by mouth 3 (three) times daily between meals.   labetalol 300 MG tablet Commonly known as:  NORMODYNE Take 300 mg by mouth 2 (two) times daily.   latanoprost 0.005 % ophthalmic solution Commonly known as:  XALATAN Place 1 drop into both eyes at bedtime.   NAMZARIC 28-10 MG Cp24 Generic drug:  Memantine HCl-Donepezil HCl Take 1 capsule by mouth every evening.   polyethylene glycol packet Commonly known as:  MIRALAX / GLYCOLAX Take 17 g by mouth daily.   polyvinyl alcohol 1.4 % ophthalmic solution Commonly known as:  LIQUIFILM TEARS Place 1 drop into both eyes daily as needed for dry eyes.   potassium chloride SA 20 MEQ  tablet Commonly known as:  K-DUR,KLOR-CON Take 2 tablets (40 mEq total) by mouth daily. What changed:  how much to take   senna 8.6 MG Tabs tablet Commonly known as:  SENOKOT Take 2 tablets by mouth at bedtime.   traZODone 50 MG tablet Commonly known as:  DESYREL Take 25 mg by mouth at bedtime. For depression       Vitals:   01/20/16 0523 01/20/16 0820  BP: 124/66 125/71  Pulse: 71 72  Resp: 15 16  Temp: 98.8 F (37.1 C) 98.8 F (37.1 C)    Skin clean, dry and intact without evidence of skin break down, no evidence of skin tears noted. IV catheter discontinued intact. Site without signs and symptoms of complications. Dressing and pressure applied. Pt denies pain at this time. No complaints noted.  An After Visit Summary was printed and given to the patient. Patient escorted via stretcher, and D/C home via ambulance.  Janeann ForehandLuke Nizhoni Parlow BSN, RN

## 2016-03-08 ENCOUNTER — Emergency Department (HOSPITAL_COMMUNITY): Payer: Medicare Other

## 2016-03-08 ENCOUNTER — Inpatient Hospital Stay (HOSPITAL_COMMUNITY): Payer: Medicare Other

## 2016-03-08 ENCOUNTER — Inpatient Hospital Stay (HOSPITAL_COMMUNITY)
Admission: EM | Admit: 2016-03-08 | Discharge: 2016-04-08 | DRG: 296 | Disposition: E | Payer: Medicare Other | Attending: Pulmonary Disease | Admitting: Pulmonary Disease

## 2016-03-08 DIAGNOSIS — G9340 Encephalopathy, unspecified: Secondary | ICD-10-CM | POA: Insufficient documentation

## 2016-03-08 DIAGNOSIS — R627 Adult failure to thrive: Secondary | ICD-10-CM | POA: Diagnosis present

## 2016-03-08 DIAGNOSIS — Z8673 Personal history of transient ischemic attack (TIA), and cerebral infarction without residual deficits: Secondary | ICD-10-CM

## 2016-03-08 DIAGNOSIS — L89150 Pressure ulcer of sacral region, unstageable: Secondary | ICD-10-CM | POA: Diagnosis present

## 2016-03-08 DIAGNOSIS — Z6822 Body mass index (BMI) 22.0-22.9, adult: Secondary | ICD-10-CM

## 2016-03-08 DIAGNOSIS — I129 Hypertensive chronic kidney disease with stage 1 through stage 4 chronic kidney disease, or unspecified chronic kidney disease: Secondary | ICD-10-CM | POA: Diagnosis present

## 2016-03-08 DIAGNOSIS — G309 Alzheimer's disease, unspecified: Secondary | ICD-10-CM | POA: Diagnosis present

## 2016-03-08 DIAGNOSIS — Z66 Do not resuscitate: Secondary | ICD-10-CM | POA: Diagnosis present

## 2016-03-08 DIAGNOSIS — R402212 Coma scale, best verbal response, none, at arrival to emergency department: Secondary | ICD-10-CM | POA: Diagnosis present

## 2016-03-08 DIAGNOSIS — F0280 Dementia in other diseases classified elsewhere without behavioral disturbance: Secondary | ICD-10-CM | POA: Diagnosis present

## 2016-03-08 DIAGNOSIS — J9600 Acute respiratory failure, unspecified whether with hypoxia or hypercapnia: Secondary | ICD-10-CM

## 2016-03-08 DIAGNOSIS — F419 Anxiety disorder, unspecified: Secondary | ICD-10-CM | POA: Diagnosis present

## 2016-03-08 DIAGNOSIS — E870 Hyperosmolality and hypernatremia: Secondary | ICD-10-CM | POA: Diagnosis present

## 2016-03-08 DIAGNOSIS — R402112 Coma scale, eyes open, never, at arrival to emergency department: Secondary | ICD-10-CM | POA: Diagnosis present

## 2016-03-08 DIAGNOSIS — J690 Pneumonitis due to inhalation of food and vomit: Secondary | ICD-10-CM | POA: Insufficient documentation

## 2016-03-08 DIAGNOSIS — N189 Chronic kidney disease, unspecified: Secondary | ICD-10-CM

## 2016-03-08 DIAGNOSIS — Z515 Encounter for palliative care: Secondary | ICD-10-CM | POA: Diagnosis present

## 2016-03-08 DIAGNOSIS — L8990 Pressure ulcer of unspecified site, unspecified stage: Secondary | ICD-10-CM | POA: Insufficient documentation

## 2016-03-08 DIAGNOSIS — R570 Cardiogenic shock: Secondary | ICD-10-CM | POA: Diagnosis present

## 2016-03-08 DIAGNOSIS — G931 Anoxic brain damage, not elsewhere classified: Secondary | ICD-10-CM | POA: Diagnosis present

## 2016-03-08 DIAGNOSIS — E785 Hyperlipidemia, unspecified: Secondary | ICD-10-CM | POA: Diagnosis present

## 2016-03-08 DIAGNOSIS — J9601 Acute respiratory failure with hypoxia: Secondary | ICD-10-CM | POA: Diagnosis present

## 2016-03-08 DIAGNOSIS — I469 Cardiac arrest, cause unspecified: Principal | ICD-10-CM

## 2016-03-08 DIAGNOSIS — K5900 Constipation, unspecified: Secondary | ICD-10-CM | POA: Diagnosis present

## 2016-03-08 DIAGNOSIS — I959 Hypotension, unspecified: Secondary | ICD-10-CM | POA: Insufficient documentation

## 2016-03-08 DIAGNOSIS — R739 Hyperglycemia, unspecified: Secondary | ICD-10-CM | POA: Insufficient documentation

## 2016-03-08 DIAGNOSIS — R579 Shock, unspecified: Secondary | ICD-10-CM | POA: Insufficient documentation

## 2016-03-08 DIAGNOSIS — N179 Acute kidney failure, unspecified: Secondary | ICD-10-CM | POA: Diagnosis present

## 2016-03-08 DIAGNOSIS — D638 Anemia in other chronic diseases classified elsewhere: Secondary | ICD-10-CM | POA: Diagnosis present

## 2016-03-08 DIAGNOSIS — R0902 Hypoxemia: Secondary | ICD-10-CM | POA: Insufficient documentation

## 2016-03-08 DIAGNOSIS — E440 Moderate protein-calorie malnutrition: Secondary | ICD-10-CM | POA: Diagnosis present

## 2016-03-08 DIAGNOSIS — R402312 Coma scale, best motor response, none, at arrival to emergency department: Secondary | ICD-10-CM | POA: Diagnosis present

## 2016-03-08 DIAGNOSIS — N183 Chronic kidney disease, stage 3 (moderate): Secondary | ICD-10-CM | POA: Diagnosis present

## 2016-03-08 LAB — CBC WITH DIFFERENTIAL/PLATELET
BASOS PCT: 0 %
Basophils Absolute: 0 10*3/uL (ref 0.0–0.1)
EOS PCT: 0 %
Eosinophils Absolute: 0 10*3/uL (ref 0.0–0.7)
HCT: 28.5 % — ABNORMAL LOW (ref 36.0–46.0)
Hemoglobin: 8.4 g/dL — ABNORMAL LOW (ref 12.0–15.0)
LYMPHS ABS: 2.8 10*3/uL (ref 0.7–4.0)
Lymphocytes Relative: 16 %
MCH: 29.1 pg (ref 26.0–34.0)
MCHC: 29.5 g/dL — ABNORMAL LOW (ref 30.0–36.0)
MCV: 98.6 fL (ref 78.0–100.0)
Monocytes Absolute: 0.4 10*3/uL (ref 0.1–1.0)
Monocytes Relative: 2 %
Neutro Abs: 14.3 10*3/uL — ABNORMAL HIGH (ref 1.7–7.7)
Neutrophils Relative %: 82 %
Platelets: 367 10*3/uL (ref 150–400)
RBC: 2.89 MIL/uL — ABNORMAL LOW (ref 3.87–5.11)
RDW: 16.4 % — AB (ref 11.5–15.5)
WBC: 17.5 10*3/uL — ABNORMAL HIGH (ref 4.0–10.5)

## 2016-03-08 LAB — URINALYSIS, ROUTINE W REFLEX MICROSCOPIC
BILIRUBIN URINE: NEGATIVE
GLUCOSE, UA: NEGATIVE mg/dL
KETONES UR: NEGATIVE mg/dL
LEUKOCYTES UA: NEGATIVE
Nitrite: NEGATIVE
PROTEIN: 100 mg/dL — AB
Specific Gravity, Urine: 1.015 (ref 1.005–1.030)
pH: 6 (ref 5.0–8.0)

## 2016-03-08 LAB — COMPREHENSIVE METABOLIC PANEL
ALBUMIN: 1.7 g/dL — AB (ref 3.5–5.0)
ALK PHOS: 126 U/L (ref 38–126)
ALT: 309 U/L — ABNORMAL HIGH (ref 14–54)
AST: 711 U/L — AB (ref 15–41)
Anion gap: 10 (ref 5–15)
BILIRUBIN TOTAL: 1.1 mg/dL (ref 0.3–1.2)
BUN: 21 mg/dL — AB (ref 6–20)
CALCIUM: 7.5 mg/dL — AB (ref 8.9–10.3)
CO2: 18 mmol/L — ABNORMAL LOW (ref 22–32)
CREATININE: 1.79 mg/dL — AB (ref 0.44–1.00)
Chloride: 127 mmol/L — ABNORMAL HIGH (ref 101–111)
GFR calc Af Amer: 28 mL/min — ABNORMAL LOW (ref >60)
GFR calc non Af Amer: 24 mL/min — ABNORMAL LOW (ref >60)
GLUCOSE: 189 mg/dL — AB (ref 65–99)
Potassium: 4.7 mmol/L (ref 3.5–5.1)
Sodium: 155 mmol/L — ABNORMAL HIGH (ref 135–145)
Total Protein: 5.1 g/dL — ABNORMAL LOW (ref 6.5–8.1)

## 2016-03-08 LAB — I-STAT CHEM 8, ED
BUN: 28 mg/dL — ABNORMAL HIGH (ref 6–20)
CALCIUM ION: 1.03 mmol/L — AB (ref 1.15–1.40)
Chloride: 126 mmol/L — ABNORMAL HIGH (ref 101–111)
Creatinine, Ser: 1.6 mg/dL — ABNORMAL HIGH (ref 0.44–1.00)
Glucose, Bld: 179 mg/dL — ABNORMAL HIGH (ref 65–99)
HCT: 26 % — ABNORMAL LOW (ref 36.0–46.0)
HEMOGLOBIN: 8.8 g/dL — AB (ref 12.0–15.0)
Potassium: 4.6 mmol/L (ref 3.5–5.1)
SODIUM: 156 mmol/L — AB (ref 135–145)
TCO2: 20 mmol/L (ref 0–100)

## 2016-03-08 LAB — I-STAT ARTERIAL BLOOD GAS, ED
ACID-BASE DEFICIT: 8 mmol/L — AB (ref 0.0–2.0)
Bicarbonate: 19.2 mmol/L — ABNORMAL LOW (ref 20.0–28.0)
O2 SAT: 100 %
TCO2: 21 mmol/L (ref 0–100)
pCO2 arterial: 47.4 mmHg (ref 32.0–48.0)
pH, Arterial: 7.216 — ABNORMAL LOW (ref 7.350–7.450)
pO2, Arterial: 209 mmHg — ABNORMAL HIGH (ref 83.0–108.0)

## 2016-03-08 LAB — MRSA PCR SCREENING: MRSA by PCR: NEGATIVE

## 2016-03-08 LAB — GLUCOSE, CAPILLARY
Glucose-Capillary: 188 mg/dL — ABNORMAL HIGH (ref 65–99)
Glucose-Capillary: 148 mg/dL — ABNORMAL HIGH (ref 65–99)

## 2016-03-08 LAB — I-STAT CG4 LACTIC ACID, ED: Lactic Acid, Venous: 6.43 mmol/L (ref 0.5–1.9)

## 2016-03-08 LAB — TROPONIN I
TROPONIN I: 0.19 ng/mL — AB (ref <0.03)
Troponin I: 0.05 ng/mL (ref <0.03)

## 2016-03-08 LAB — TYPE AND SCREEN
ABO/RH(D): O POS
ANTIBODY SCREEN: NEGATIVE

## 2016-03-08 LAB — URINE MICROSCOPIC-ADD ON

## 2016-03-08 LAB — I-STAT TROPONIN, ED: TROPONIN I, POC: 0 ng/mL (ref 0.00–0.08)

## 2016-03-08 LAB — PROTIME-INR
INR: 1.47
Prothrombin Time: 18 seconds — ABNORMAL HIGH (ref 11.4–15.2)

## 2016-03-08 LAB — LACTIC ACID, PLASMA: Lactic Acid, Venous: 3.4 mmol/L (ref 0.5–1.9)

## 2016-03-08 LAB — ABO/RH: ABO/RH(D): O POS

## 2016-03-08 LAB — BRAIN NATRIURETIC PEPTIDE: B Natriuretic Peptide: 684.6 pg/mL — ABNORMAL HIGH (ref 0.0–100.0)

## 2016-03-08 MED ORDER — HEPARIN SODIUM (PORCINE) 5000 UNIT/ML IJ SOLN
5000.0000 [IU] | Freq: Three times a day (TID) | INTRAMUSCULAR | Status: DC
  Administered 2016-03-08 – 2016-03-10 (×6): 5000 [IU] via SUBCUTANEOUS
  Filled 2016-03-08 (×7): qty 1

## 2016-03-08 MED ORDER — NOREPINEPHRINE BITARTRATE 1 MG/ML IV SOLN
0.0000 ug/min | INTRAVENOUS | Status: DC
  Administered 2016-03-08: 10 ug/min via INTRAVENOUS
  Administered 2016-03-09: 12 ug/min via INTRAVENOUS
  Administered 2016-03-10: 4 ug/min via INTRAVENOUS
  Filled 2016-03-08 (×6): qty 4

## 2016-03-08 MED ORDER — FAMOTIDINE IN NACL 20-0.9 MG/50ML-% IV SOLN
20.0000 mg | INTRAVENOUS | Status: DC
  Administered 2016-03-08 – 2016-03-09 (×2): 20 mg via INTRAVENOUS
  Filled 2016-03-08 (×2): qty 50

## 2016-03-08 MED ORDER — VANCOMYCIN HCL IN DEXTROSE 1-5 GM/200ML-% IV SOLN
1000.0000 mg | Freq: Once | INTRAVENOUS | Status: AC
  Administered 2016-03-08: 1000 mg via INTRAVENOUS
  Filled 2016-03-08: qty 200

## 2016-03-08 MED ORDER — VANCOMYCIN HCL 500 MG IV SOLR
500.0000 mg | INTRAVENOUS | Status: DC
  Administered 2016-03-09: 500 mg via INTRAVENOUS
  Filled 2016-03-08 (×2): qty 500

## 2016-03-08 MED ORDER — SODIUM CHLORIDE 0.9 % IV SOLN: INTRAVENOUS | Status: DC

## 2016-03-08 MED ORDER — MIDAZOLAM HCL 2 MG/2ML IJ SOLN
1.0000 mg | INTRAMUSCULAR | Status: DC | PRN
  Filled 2016-03-08 (×2): qty 2

## 2016-03-08 MED ORDER — FAMOTIDINE IN NACL 20-0.9 MG/50ML-% IV SOLN: 20.0000 mg | Freq: Two times a day (BID) | INTRAVENOUS | Status: DC

## 2016-03-08 MED ORDER — CHLORHEXIDINE GLUCONATE 0.12% ORAL RINSE (MEDLINE KIT)
15.0000 mL | Freq: Two times a day (BID) | OROMUCOSAL | Status: DC
  Administered 2016-03-09 – 2016-03-10 (×3): 15 mL via OROMUCOSAL

## 2016-03-08 MED ORDER — PIPERACILLIN-TAZOBACTAM 3.375 G IVPB
3.3750 g | Freq: Three times a day (TID) | INTRAVENOUS | Status: DC
  Administered 2016-03-09 – 2016-03-10 (×5): 3.375 g via INTRAVENOUS
  Filled 2016-03-08 (×6): qty 50

## 2016-03-08 MED ORDER — INSULIN ASPART 100 UNIT/ML ~~LOC~~ SOLN
0.0000 [IU] | SUBCUTANEOUS | Status: DC
  Administered 2016-03-08: 1 [IU] via SUBCUTANEOUS
  Administered 2016-03-08: 2 [IU] via SUBCUTANEOUS
  Administered 2016-03-09: 1 [IU] via SUBCUTANEOUS
  Administered 2016-03-09: 2 [IU] via SUBCUTANEOUS
  Administered 2016-03-09 – 2016-03-10 (×3): 1 [IU] via SUBCUTANEOUS

## 2016-03-08 MED ORDER — DOCUSATE SODIUM 50 MG/5ML PO LIQD
100.0000 mg | Freq: Two times a day (BID) | ORAL | Status: DC | PRN
  Filled 2016-03-08: qty 10

## 2016-03-08 MED ORDER — SODIUM CHLORIDE 0.9 % IV BOLUS (SEPSIS)
1000.0000 mL | Freq: Once | INTRAVENOUS | Status: AC
  Administered 2016-03-08: 1000 mL via INTRAVENOUS

## 2016-03-08 MED ORDER — FENTANYL CITRATE (PF) 100 MCG/2ML IJ SOLN
50.0000 ug | INTRAMUSCULAR | Status: DC | PRN
Start: 2016-03-08 — End: 2016-03-10
  Administered 2016-03-09: 50 ug via INTRAVENOUS
  Filled 2016-03-08 (×3): qty 2

## 2016-03-08 MED ORDER — FENTANYL CITRATE (PF) 100 MCG/2ML IJ SOLN
50.0000 ug | INTRAMUSCULAR | Status: DC | PRN
  Administered 2016-03-09 (×5): 50 ug via INTRAVENOUS
  Filled 2016-03-08 (×3): qty 2

## 2016-03-08 MED ORDER — DEXTROSE 5 % IV SOLN
2.0000 g | Freq: Once | INTRAVENOUS | Status: DC
  Filled 2016-03-08: qty 2

## 2016-03-08 MED ORDER — SODIUM CHLORIDE 0.45 % IV SOLN
INTRAVENOUS | Status: DC
  Administered 2016-03-08: 17:00:00 via INTRAVENOUS
  Administered 2016-03-09: 1000 mL via INTRAVENOUS

## 2016-03-08 MED ORDER — BISACODYL 10 MG RE SUPP: 10.0000 mg | Freq: Every day | RECTAL | Status: DC | PRN

## 2016-03-08 MED ORDER — SODIUM CHLORIDE 0.9 % IV SOLN: 250.0000 mL | INTRAVENOUS | Status: DC | PRN

## 2016-03-08 MED ORDER — PIPERACILLIN-TAZOBACTAM 3.375 G IVPB 30 MIN
3.3750 g | Freq: Once | INTRAVENOUS | Status: AC
  Administered 2016-03-08: 3.375 g via INTRAVENOUS
  Filled 2016-03-08: qty 50

## 2016-03-08 MED ORDER — SODIUM CHLORIDE 0.9 % IV SOLN
1.0000 g | Freq: Once | INTRAVENOUS | Status: AC
  Administered 2016-03-08: 1 g via INTRAVENOUS
  Filled 2016-03-08 (×2): qty 10

## 2016-03-08 MED ORDER — ORAL CARE MOUTH RINSE
15.0000 mL | OROMUCOSAL | Status: DC
  Administered 2016-03-08 – 2016-03-10 (×17): 15 mL via OROMUCOSAL

## 2016-03-08 MED ORDER — MIDAZOLAM HCL 2 MG/2ML IJ SOLN
1.0000 mg | INTRAMUSCULAR | Status: DC | PRN
  Administered 2016-03-08 – 2016-03-10 (×8): 1 mg via INTRAVENOUS
  Filled 2016-03-08 (×6): qty 2

## 2016-03-08 NOTE — ED Provider Notes (Signed)
[facility]-EMERGENCY DEPT Provider Note   CSN: [id number] Arrival date & time: 02/19/2016  1420     History   Chief Complaint Chief Complaint  Patient presents with  . Cardiac Arrest    HPI Wanda Hughes is a 82 y.o. female.  HPI  82 year old female presents after cardiac arrest. History is taken from EMS. She was seen normal eating food this afternoon. About 15 minutes later they checked on her room and she was unresponsive. First responders and the nursing home did about 15 minutes of CPR. EMS did another 10 minutes and then regained spontaneous circulation. She had pulse for about 10 minutes and then coded again. During this time she was also intubated by EMS. She then coded for another 10 minutes and regained circulation upon arrival to the ER. She has received 4 or 5 epinephrines in total. Patient recently has been treated for a UTI as well.  No past medical history on file.  Patient Active Problem List   Diagnosis Date Noted  . Cardiac arrest (HCC) 02/16/2016    No past surgical history on file.  OB History    No data available       Home Medications    Prior to Admission medications   Not on File    Family History No family history on file.  Social History Social History  Substance Use Topics  . Smoking status: Not on file  . Smokeless tobacco: Not on file  . Alcohol use Not on file     Allergies   Review of patient's allergies indicates not on file.   Review of Systems Review of Systems  Unable to perform ROS: Patient unresponsive     Physical Exam Updated Vital Signs BP (!) 86/50   Pulse 65   Resp 22   SpO2 92%   Physical Exam  Constitutional: She appears cachectic. She is intubated.  HENT:  Head: Normocephalic and atraumatic.  Right Ear: External ear normal.  Left Ear: External ear normal.  Nose: Nose normal.  Eyes: Right eye exhibits no discharge. Left eye exhibits no discharge.  pinpoint pupils bilaterallly  Cardiovascular: Normal  rate and normal heart sounds.  An irregular rhythm present.  Pulses:      Femoral pulses are 2+ on the right side, and 2+ on the left side. Pulmonary/Chest: Bradypnea noted. She is intubated. She has rhonchi.  Abdominal: Soft. There is no tenderness.  Musculoskeletal: She exhibits edema (bilateral ankles).  Neurological: She is unresponsive. GCS eye subscore is 1. GCS verbal subscore is 1. GCS motor subscore is 1.  No response to pain, spontaneous respirations  Skin: Skin is warm and dry.  Nursing note and vitals reviewed.    ED Treatments / Results  Labs (all labs ordered are listed, but only abnormal results are displayed) Labs Reviewed  PROTIME-INR - Abnormal; Notable for the following:       Result Value   Prothrombin Time 18.0 (*)    All other components within normal limits  I-STAT CG4 LACTIC ACID, ED - Abnormal; Notable for the following:    Lactic Acid, Venous 6.43 (*)    All other components within normal limits  I-STAT CHEM 8, ED - Abnormal; Notable for the following:    Sodium 156 (*)    Chloride 126 (*)    BUN 28 (*)    Creatinine, Ser 1.60 (*)    Glucose, Bld 179 (*)    Calcium, Ion 1.03 (*)    Hemoglobin 8.8 (*)  HCT 26.0 (*)    All other components within normal limits  I-STAT ARTERIAL BLOOD GAS, ED - Abnormal; Notable for the following:    pH, Arterial 7.216 (*)    pO2, Arterial 209.0 (*)    Bicarbonate 19.2 (*)    Acid-base deficit 8.0 (*)    All other components within normal limits  URINE CULTURE  CULTURE, BLOOD (ROUTINE X 2)  CULTURE, BLOOD (ROUTINE X 2)  CULTURE, RESPIRATORY (NON-EXPECTORATED)  CULTURE, BLOOD (ROUTINE X 2)  CULTURE, BLOOD (ROUTINE X 2)  BLOOD GAS, ARTERIAL  COMPREHENSIVE METABOLIC PANEL  TROPONIN I  BRAIN NATRIURETIC PEPTIDE  CBC WITH DIFFERENTIAL/PLATELET  URINALYSIS, ROUTINE W REFLEX MICROSCOPIC (NOT [facility])  LACTIC ACID, PLASMA  LACTIC ACID, PLASMA  TROPONIN I  TROPONIN I  I-STAT TROPOININ, ED  TYPE AND SCREEN     EKG  EKG Interpretation  Date/Time:  Tuesday March 08 2016 14:38:48 EDT Ventricular Rate:  97 PR Interval:    QRS Duration: 124 QT Interval:  472 QTC Calculation: 502 R Axis:   -50 Text Interpretation:  Atrial fibrillation Ventricular tachycardia, unsustained Nonspecific IVCD with LAD Artifact in lead(s) I III aVR aVL too much artifact, limits interpretation Confirmed by Madiline Saffran MD, Memory Heinrichs [id number]) on 03-02-16 2:56:02 PM       Radiology Dg Chest Portable 1 View  Result Date: 03-02-16 CLINICAL DATA:  Patient found unconscious EXAM: PORTABLE CHEST 1 VIEW COMPARISON:  None. FINDINGS: Portable view chest demonstrates left-sided pacer patch is. Partially visualized lower cervical spine hardware. Faintly visualized endotracheal tube tip just above the carina. Esophageal tube tip is below the diaphragm. Mild cardiomegaly with atherosclerosis. Moderate diffuse bilateral interstitial opacities suggest edema or possible diffuse pneumonia with more focal right infrahilar opacity. No effusion. No pneumothorax. IMPRESSION: 1. Endotracheal tube tip is near the carina. 2. Mild cardiomegaly. 3. Mild to moderate diffuse interstitial opacities suggest edema or diffuse infection. There is more focal infiltrate in the right medial lung base. 4. Atherosclerosis of the aorta. Electronically Signed   By: Jasmine PangKim  Fujinaga M.D.   On: 03-02-16 15:10    Procedures Procedures (including critical care time)  Medications Ordered in ED Medications  norepinephrine (LEVOPHED) 4 mg in dextrose 5 % 250 mL (0.016 mg/mL) infusion (12 mcg/min Intravenous Rate/Dose Change Jan 19, 2016 1505)  ceFEPIme (MAXIPIME) 2 g in dextrose 5 % 50 mL IVPB (not administered)  vancomycin (VANCOCIN) IVPB 1000 mg/200 mL premix (not administered)  0.9 %  sodium chloride infusion (not administered)  0.9 %  sodium chloride infusion (not administered)  famotidine (PEPCID) IVPB 20 mg premix (not administered)  heparin injection 5,000  Units (not administered)  sodium chloride 0.9 % bolus 1,000 mL (1,000 mLs Intravenous New Bag/Given Jan 19, 2016 1448)  sodium chloride 0.9 % bolus 1,000 mL (1,000 mLs Intravenous New Bag/Given Jan 19, 2016 1523)    CRITICAL CARE Performed by: Pricilla LovelessGOLDSTON, Rigley Niess T   Total critical care time: 35 minutes  Critical care time was exclusive of separately billable procedures and treating other patients.  Critical care was necessary to treat or prevent imminent or life-threatening deterioration.  Critical care was time spent personally by me on the following activities: development of treatment plan with patient and/or surrogate as well as nursing, discussions with consultants, evaluation of patient's response to treatment, examination of patient, obtaining history from patient or surrogate, ordering and performing treatments and interventions, ordering and review of laboratory studies, ordering and review of radiographic studies, pulse oximetry and re-evaluation of patient's condition.  Initial Impression / Assessment and  Plan / ED Course  I have reviewed the triage vital signs and the nursing notes.  Pertinent labs & imaging results that were available during my care of the patient were reviewed by me and considered in my medical decision making (see chart for details).  Clinical Course    Patient is hypotensive on arrival. She was started on a norepinephrine drip and given IV fluids. Given broad antibiotics as she may have aspirated and her x-ray looks like pneumonia as well as edema. She is making some spontaneous breaths but it is agonal through the vent. She does not respond to pain but throughout her ED stay she started having posturing. Initially no family is available by phone or in person. ICU has now been able to get in touch with family and at this point she is a DO NOT RESUSCITATE but they want to continue with the current resuscitation. Admit to ICU in critical condition.  Final Clinical  Impressions(s) / ED Diagnoses   Final diagnoses:  Cardiac arrest [facility](HCC)    New Prescriptions New Prescriptions   No medications on file     Pricilla LovelessScott Ahtziri Jeffries, MD 05/05/2016 1549

## 2016-03-08 NOTE — ED Notes (Signed)
Warm blankets on pt.

## 2016-03-08 NOTE — Progress Notes (Signed)
Responded to post CPR in progress.  Patient was  found unresponsive in her room at  nursing facility and CPR was started and Ems was  called.  Patient recovered and is currently intubated. Chaplain made several  calls to patient son but calls went to voice mail.  I also called nursing home and spoke with staff who said that family had been notified.  Venida JarvisWatlington, Dent Plantz, [place], [facility], pager [phone]

## 2016-03-08 NOTE — ED Notes (Signed)
Per nurse she will cancel 2nd set of blood cultures.

## 2016-03-08 NOTE — ED Triage Notes (Signed)
Pt found unresponsive in her room at  nursing facility and was PEA  cpr started and ems called. EMS found pt in PEA continued cpr for approx 14 minutes and gave 4mg  epi and transported to ED. EN route lost pulses again and gave a total of 5 mg epi and on arrival pt is NSR 70 gcs 3 intubated on arrival with 7.0 ET tube 25 at the lip. verified with co2 detector and auscultated on arrival. Xray caled to confirm

## 2016-03-08 NOTE — Progress Notes (Signed)
Pharmacy Antibiotic Note  Wanda Hughes is a 82 y.o. female admitted on 02/24/2016 with pneumonia.  Pharmacy has been consulted for vancomycin and zosyn dosing. Pt brought in from nursing home, found down w/ cardiac arrest. Stabilized in ED, currently intubated.  Plan: Vancomycin 1000 mg IV x1 then 500 mg Iv q24h Zosyn 3.375g IV 30 min infusion x1 then 3.375g Iv extended infusion q8h Monitor clinical course and renal function  Height: 5\' 6"  (167.6 cm) IBW/kg (Calculated) : 59.3  Temp (24hrs), Avg:95.7 F (35.4 C), Min:95.7 F (35.4 C), Max:95.7 F (35.4 C)   Recent Labs Lab 02/24/2016 1444 03/04/2016 1502 02/16/2016 1504  WBC 17.5*  --   --   CREATININE 1.79*  --  1.60*  LATICACIDVEN  --  6.43*  --     CrCl cannot be calculated (Unknown ideal weight.).   Est CrCl 25 mL/min  Allergies not on file  Antimicrobials this admission:  Vancomycin 10/31 >> Zosyn 10/31 >>  Dose adjustments this admission:  N/A  Microbiology results:  10/31 BCx: Sent 10/31 UCx: Sent Thank you for allowing pharmacy to be a part of this patient's care.  Lianne CureJustin R Chesnie Capell, PharmD Candidate 02/18/2016 4:18 PM

## 2016-03-08 NOTE — ED Notes (Signed)
Critical care md is on phone with Family at this time to make decisions about care of patient.

## 2016-03-08 NOTE — Progress Notes (Signed)
CRITICAL VALUE ALERT  Critical value received: lactic acid 3.4  Date of notification:  02/20/2016  Time of notification:  1933  Critical value read back:Yes.    Nurse who received alert:  Martie LeePage Sammye Staff, RN  MD notified (1st Eastin Swing):  Pola CornELink MD notified  Time of first Odella Appelhans:  1933

## 2016-03-08 NOTE — ED Notes (Signed)
After speaking with family pt is now a DNR and levophed is to be maxed at 5115mcg(current rate).

## 2016-03-08 NOTE — ED Notes (Signed)
Attempting to draw labs before blood cultures.

## 2016-03-08 NOTE — ED Notes (Signed)
Critical care MD ask me to not go up any further on levophed until he speaks with family.

## 2016-03-08 NOTE — ED Notes (Signed)
Soiled pt cleaned and placed on dry pad, re applied wet to dry dressing covered with a pad over sacrum ulcer.

## 2016-03-08 NOTE — H&P (Signed)
PULMONARY / CRITICAL CARE MEDICINE   Name: Wanda Hughes Tufte MRN: [id number] DOB: 03-24-1925    ADMISSION DATE:  12/22/15 CONSULTATION DATE:  10-01-2015  REFERRING MD:  Criss AlvineGoldston  CHIEF COMPLAINT:  Cardiac Arrest  HISTORY OF PRESENT ILLNESS:  Pt is encephelopathic; therefore, this HPI is obtained from chart review. Wanda Hughes Segar is a 82 y.o. female with PMH of significant Alzheimer's, TIA, SAH, Anxiety, HTN, HLD, Constipation, CKD.  She resides in a nursing home.  On 10/31, [facility] with cardiac arrest.  She had apparently been given lunch and was normal then.  Roughly 2 - 3 hours later, she was found unresponsive in her bed without pulses.  CPR was initiated and EMS was dispatched. On EMS arrival, she was noted to be PEA.  She received roughly 15 minutes of ACLS measures including epi x 5 before ROSC.  She was intubated in the field and en route to ED, she unfortunately lost pulses once again.  She received an additional 5 minutes of ACLS before ROSC.  [facility], she was started on levophed for shock, was pan cultured, and given IVF.  On review of prior records, it is noted that she had recent admission last month for dehydration, FTT, severe constipation with impaction.  At the time, had discussion with palliative care and DNR orders placed.  I called pt's son Melvyn NethLewis who is HCPOA but Lewis stated that he did not recall any DNR discussions and asked that we continue to be aggressive with care.  I then relayed that while respecting his wishes, Mrs. Sandria ManlyLove is critically ill and her past history, current vitals, and physical exam all point towards extremely poor prognosis.  He asked if he could three way call his brother Maisie Fushomas so that I could have a discussion with both of them. I then had discussion with both sons and relayed the same above info.  While communicating with them, Mrs. Sandria ManlyLove began to show decerebrate posturing. I relayed this to the sons and explained my concern for  significant anoxic brain injury (in addition to further findings of no pupillary reflex, no corneals, no gag, no spontaneous respirations over vent).  I also expressed my concern for cardiogenic shock given her systolic BP of 60 despite 12mcg levophed.  After extensive discussions, both Lewis and Maisie Fushomas agreed that their mother would not want such heroic measures under these circumstances.  In fact, Maisie Fushomas stated that he did seem to recall previous DNR orders from last admission and felt that his mother would certainly opt for this route if she were able to speak for herself right now.  I explained comfort care vs continuing with supportive care (with understanding of DNR and no escalation of pressors, etc).  Sons both asked that we continue full supportive care including cultures/abx/fluids in hopes that they can make it to [place] in time Maisie Fus(Thomas and his other 2nd brother Gerlene BurdockRichard live in [place], while 3rd brother Melvyn [place]Lewis lives in [place]  [place]s are all making arrangements to [place] [facility] [place] [place].    PAST MEDICAL HISTORY :  She  has no past medical history on file.  PAST SURGICAL HISTORY: She  has no past surgical history on file.  Allergies not on file  No current facility-administered medications on file prior to encounter.    No current outpatient prescriptions on file prior to encounter.    FAMILY HISTORY:  Her has no family status information on file.    SOCIAL  HISTORY: She    REVIEW OF SYSTEMS:   Unable to obtain as pt is encephalopathic.  SUBJECTIVE:  On vent, unresponsive.  VITAL SIGNS: BP (!) 86/50   Pulse 65   Resp 22   SpO2 92%   HEMODYNAMICS:    VENTILATOR SETTINGS: Vent Mode: PRVC FiO2 (%):  [100 %] 100 % Set Rate:  [15 bmp] 15 bmp Vt Set:  [480 mL] 480 mL PEEP:  [5 cmH20] 5 cmH20 Plateau Pressure:  [18 cmH20] 18 cmH20  INTAKE / OUTPUT: No intake/output data recorded.   PHYSICAL EXAMINATION: General: Chronically ill appearing, frail  elderly female, critically ill. Neuro: On vent, completely unresponsive.  No corneals, no gag, no spontaneous respirations over vent. HEENT: [place]/AT. Pupils non-responsive. MM dry. Cardiovascular: RRR, no M/R/G.  Lungs: Respirations even and unlabored.  Coarse bilaterally. Abdomen: BS hypoacive soft, NT/ND.  Musculoskeletal: Muscle wasting throughout.  No gross deformities, no edema.  Skin: Unstageable sacral wound with feculent material inside and surrounding.  LABS:  BMET  Recent Labs Lab 03/13/2016 1504  NA 156*  K 4.6  CL 126*  BUN 28*  CREATININE 1.60*  GLUCOSE 179*    Electrolytes No results for input(s): CALCIUM, MG, PHOS in the last 168 hours.  CBC  Recent Labs Lab Mar 13, 2016 1504  HGB 8.8*  HCT 26.0*    Coag's  Recent Labs Lab 03-13-2016 1444  INR 1.47    Sepsis Markers  Recent Labs Lab 03-13-16 1502  LATICACIDVEN 6.43*    ABG  Recent Labs Lab 03-13-2016 1444  PHART 7.216*  PCO2ART 47.4  PO2ART 209.0*    Liver Enzymes No results for input(s): AST, ALT, ALKPHOS, BILITOT, ALBUMIN in the last 168 hours.  Cardiac Enzymes No results for input(s): TROPONINI, PROBNP in the last 168 hours.  Glucose No results for input(s): GLUCAP in the last 168 hours.  Imaging Dg Chest Portable 1 View  Result Date: 03-13-2016 CLINICAL DATA:  Patient found unconscious EXAM: PORTABLE CHEST 1 VIEW COMPARISON:  None. FINDINGS: Portable view chest demonstrates left-sided pacer patch is. Partially visualized lower cervical spine hardware. Faintly visualized endotracheal tube tip just above the carina. Esophageal tube tip is below the diaphragm. Mild cardiomegaly with atherosclerosis. Moderate diffuse bilateral interstitial opacities suggest edema or possible diffuse pneumonia with more focal right infrahilar opacity. No effusion. No pneumothorax. IMPRESSION: 1. Endotracheal tube tip is near the carina. 2. Mild cardiomegaly. 3. Mild to moderate diffuse interstitial  opacities suggest edema or diffuse infection. There is more focal infiltrate in the right medial lung base. 4. Atherosclerosis of the aorta. Electronically Signed   By: Jasmine Pang M.D.   On: 03-13-2016 15:10     STUDIES:  CXR 10/31 > mod diffuse opacities. CT head 10/31 > EEG 10/31 >  CULTURES: Blood 10/31 > Sputum 10/31 > Sacral wound 10/31 >  ANTIBIOTICS: Vanc 10/31 > Zosyn 10/31 >  SIGNIFICANT EVENTS: 10/31 > admitted after unwitnessed out of hospital cardiac arrest.  LINES/TUBES: ETT 10/31 >  DISCUSSION: 82 y.o. female with hx significant Alzheimer's and who resides in nursing home, admitted 10/31 after unwitnessed out of hospital PEA cardiac arrest.  Had roughly 15 minutes ACLS before ROSC then second episode of 5 minutes ACLS.  Intubated in field but after arrival to [facility], physical exam findings concerning for significant anoxic injury.  Extensive discussions with family (2 sons Melvyn Neth and Maisie Fus) and DNR orders placed.  ASSESSMENT / PLAN:  CARDIOVASCULAR A:  PEA cardiac arrest - out of hospital / unwitnessed.  Roughly 20 minutes total (2 separate occurrences CPR / ACLS). Shock - primarily cardiogenic due to above, but also can not rule out septic due to significant massive sacral decub. Hx HTN, HLD. DNR STATUS. P:  Continue IVF's. Continue norepi at current rate, no escalation. Trend troponins, lactate. Hold preadmission amlodipine, clonidine, ezetimibe, labetalol.  NEUROLOGIC A:   Acute encephalopathy - with concern for significant anoxic injury. Hx significant Alzheimer's, SAH, TIA, anxiety. P:   Sedation:  Fentanyl PRN / Midazolam PRN. RASS goal: 0 to -1. Daily WUA. F/u on CT. EEG. Hold preadmission cetirizine, memantine, trazodone.  PULMONARY A: Acute hypoxic respiratory failure - presumed due to aspiration followed by cardiac arrest. Presumed aspiration. P:   Full vent support. Assess ABG. Wean as able. VAP prevention measures. No SBT unless  neuro status improves. Abx / cultures per ID section. CXR in AM.  RENAL A:   Hypernatremia. AoCKD. Hypocalcemia. P:   1/2 NS @ 100. 1g Ca gluconate. BMP in AM.  GASTROINTESTINAL A:   GI prophylaxis. Nutrition. Hx constipation. P:   SUP: Famotidine. NPO. Colace, Dulcolax.  HEMATOLOGIC A:   Anemia. VTE Prophylaxis. P:  Transfuse for Hgb < 7. SCD's / heparin. CBC in AM.  INFECTIOUS A:   Concern for sepsis - due to significant massive sacral decub. P:   Abx as above (vanc / zosyn).  Follow cultures as above.  ENDOCRINE A:   Hyperglycemia.   P:   SSI.   Family updated: I called pt's son Melvyn NethLewis who is HCPOA but Lewis stated that he did not recall any DNR discussions and asked that we continue to be aggressive with care.  I then relayed that while respecting his wishes, Mrs. Sandria ManlyLove is critically ill and her past history, current vitals, and physical exam all point towards extremely poor prognosis.  He asked if he could three way call his brother Maisie Fushomas so that I could have a discussion with both of them. I then had discussion with both sons and relayed the same above info.  While communicating with them, Mrs. Sandria ManlyLove began to show decerebrate posturing. I relayed this to the sons and explained my concern for significant anoxic brain injury (in addition to further findings of no pupillary reflex, no corneals, no gag, no spontaneous respirations over vent).  I also expressed my concern for cardiogenic shock given her systolic BP of 60 despite 12mcg levophed.  After extensive discussions, both Lewis and Maisie Fushomas agreed that their mother would not want such heroic measures under these circumstances.  In fact, Maisie Fushomas stated that he did seem to recall previous DNR orders from last admission and felt that his mother would certainly opt for this route if she were able to speak for herself right now.  I explained comfort care vs continuing with supportive care (with understanding of DNR  and no escalation of pressors, etc).  Sons both asked that we continue full supportive care including cultures/abx/fluids in hopes that they can make it to [place] in [place] Maisie Fus(Thomas and his other 2nd brother Gerlene BurdockRichard live in [place], while 3rd brother Melvyn NethLewis lives in [place]).  Brothers are all making arrangements to [place] it [place] [facility] [place].    Interdisciplinary Family Meeting v Palliative Care Meeting:  Due by: 03/14/16.  CC time: 70 minutes.   Rutherford Guysahul Faviola Klare, [place] - [facility] [place] Pager: [phone]  or [phone] 02/15/2016, 3:48 PM

## 2016-03-09 ENCOUNTER — Inpatient Hospital Stay (HOSPITAL_COMMUNITY): Payer: Medicare Other

## 2016-03-09 DIAGNOSIS — G9340 Encephalopathy, unspecified: Secondary | ICD-10-CM

## 2016-03-09 LAB — BLOOD GAS, ARTERIAL
Acid-base deficit: 4.4 mmol/L — ABNORMAL HIGH (ref 0.0–2.0)
Bicarbonate: 19 mmol/L — ABNORMAL LOW (ref 20.0–28.0)
DRAWN BY: 42624
FIO2: 0.5
MECHVT: 480 mL
O2 Saturation: 95.7 %
PEEP: 5 cmH2O
PH ART: 7.444 (ref 7.350–7.450)
Patient temperature: 98.6
RATE: 15 resp/min
pCO2 arterial: 28.2 mmHg — ABNORMAL LOW (ref 32.0–48.0)
pO2, Arterial: 75 mmHg — ABNORMAL LOW (ref 83.0–108.0)

## 2016-03-09 LAB — CBC
HEMATOCRIT: 26.3 % — AB (ref 36.0–46.0)
Hemoglobin: 8.1 g/dL — ABNORMAL LOW (ref 12.0–15.0)
MCH: 28.6 pg (ref 26.0–34.0)
MCHC: 30.8 g/dL (ref 30.0–36.0)
MCV: 92.9 fL (ref 78.0–100.0)
PLATELETS: 397 10*3/uL (ref 150–400)
RBC: 2.83 MIL/uL — AB (ref 3.87–5.11)
RDW: 15.9 % — ABNORMAL HIGH (ref 11.5–15.5)
WBC: 29.1 10*3/uL — ABNORMAL HIGH (ref 4.0–10.5)

## 2016-03-09 LAB — GLUCOSE, CAPILLARY
GLUCOSE-CAPILLARY: 103 mg/dL — AB (ref 65–99)
GLUCOSE-CAPILLARY: 134 mg/dL — AB (ref 65–99)
GLUCOSE-CAPILLARY: 134 mg/dL — AB (ref 65–99)
Glucose-Capillary: 159 mg/dL — ABNORMAL HIGH (ref 65–99)
Glucose-Capillary: 132 mg/dL — ABNORMAL HIGH (ref 65–99)
Glucose-Capillary: 121 mg/dL — ABNORMAL HIGH (ref 65–99)

## 2016-03-09 LAB — BASIC METABOLIC PANEL
Anion gap: 8 (ref 5–15)
BUN: 22 mg/dL — AB (ref 6–20)
CALCIUM: 7.4 mg/dL — AB (ref 8.9–10.3)
CO2: 20 mmol/L — ABNORMAL LOW (ref 22–32)
Chloride: 123 mmol/L — ABNORMAL HIGH (ref 101–111)
Creatinine, Ser: 1.44 mg/dL — ABNORMAL HIGH (ref 0.44–1.00)
GFR calc Af Amer: 36 mL/min — ABNORMAL LOW (ref >60)
GFR, EST NON AFRICAN AMERICAN: 31 mL/min — AB (ref >60)
GLUCOSE: 146 mg/dL — AB (ref 65–99)
Potassium: 3.5 mmol/L (ref 3.5–5.1)
Sodium: 151 mmol/L — ABNORMAL HIGH (ref 135–145)

## 2016-03-09 LAB — URINE CULTURE: Culture: NO GROWTH

## 2016-03-09 LAB — MAGNESIUM: MAGNESIUM: 2.3 mg/dL (ref 1.7–2.4)

## 2016-03-09 LAB — TROPONIN I: TROPONIN I: 0.13 ng/mL — AB (ref <0.03)

## 2016-03-09 LAB — PHOSPHORUS: PHOSPHORUS: 3.8 mg/dL (ref 2.5–4.6)

## 2016-03-09 MED ORDER — PROPOFOL 1000 MG/100ML IV EMUL
INTRAVENOUS | Status: AC
  Administered 2016-03-09: 10 ug/kg/min via INTRAVENOUS
  Filled 2016-03-09: qty 100

## 2016-03-09 MED ORDER — PROPOFOL 1000 MG/100ML IV EMUL
5.0000 ug/kg/min | INTRAVENOUS | Status: DC
  Administered 2016-03-09: 15 ug/kg/min via INTRAVENOUS
  Administered 2016-03-09: 10 ug/kg/min via INTRAVENOUS
  Filled 2016-03-09 (×2): qty 100

## 2016-03-09 MED ORDER — SODIUM CHLORIDE 0.9 % IV SOLN: 250.0000 mL | INTRAVENOUS | Status: DC | PRN

## 2016-03-09 MED ORDER — SODIUM CHLORIDE 0.9 % IV SOLN
250.0000 mL | INTRAVENOUS | Status: DC
  Administered 2016-03-09 (×2): 250 mL via INTRAVENOUS

## 2016-03-09 MED FILL — Medication: Qty: 1 | Status: AC

## 2016-03-09 NOTE — Procedures (Signed)
ELECTROENCEPHALOGRAM REPORT  Date of Study: 03/09/16  Patient's Name: Wanda Hughes Faraci MRN: [id number] Date of Birth: 16-Nov-1924  Clinical History: 82 y/o s/p CP arrest with MS change   Technical Summary: A multichannel digital EEG recording measured by the international 10-20 system with electrodes applied with paste and impedances below 5000 ohms performed in our laboratory with EKG monitoring in an intubated and unresponsive patient.  Hyperventilation and photic stimulation were not performed.  The digital EEG was referentially recorded, reformatted, and digitally filtered in a variety of bipolar and referential montages for optimal display.    Description: There is loss of normal background activity. The records read at a sensitivity of 3 uV/mm shows diffuse suppression of background activity. There is no spontaneous reactivity.  Noxious stimuli not given to determine background reactivity. There is muscle artifact over the frontal leads. Hyperventilation and photic stimulation were not performed. There were no epileptiform discharges or electrographic seizures seen.  There were 1-3 seconds of sharp wave bursts followed by up to 20-25 seconds of suppressed activity.  EKG lead was unremarkable.  Impression: This EEG is markedly abnormal due to burst-suppression pattern with intermittent bilateral periodic sharp waves.   Clinical Correlation: This record shows evidence of severe diffuse or bilateral cerebral dysfunction which more likely reflects the patient's history of anoxic brain injury as opposed to ictal epileptiform activity. In the absence of CNS active, sedating, or anesthetic medications, this suggests a poor prognosis.

## 2016-03-09 NOTE — Consult Note (Addendum)
WOC Nurse wound consult note Reason for Consult: Consult requested for sacrum wound. Wound type: Chronic stage 4 pressure injury Pressure Ulcer POA: Yes Measurement: 9X8X2cm with tunneling at 5:oo o'clock to 5 cm and at 7:00 o'clock to 9 cm Wound bed: Exposed sacrum bone is covered with yellow slough.  Inner tunneling areas of wound are necrotic with mod amt brown drainage, strong foul odor, and eschar to the outer wounds Dressing procedure/placement/frequency: Moist gauze packing to absorb odor and drainage. If aggressive plan of care is desired, then pt could benefit from a surgical consult or hydrotherapy related to large necrotic wound on sacrum. If family decides on comfort care goals, then these treatments should not be implemented. Consider r/o osteomylitis if sepsis is a concern. Pt is on Sport low-airloss bed to reduce pressure; no family members are present to discuss plan of care. Please re-consult if further assistance is needed.  Thank-you,  Cammie Mcgeeawn Tyronne Blann MSN, RN, CWOCN, [place]AP, CNS [phone]

## 2016-03-09 NOTE — Progress Notes (Signed)
EEG Completed; Results Pending  

## 2016-03-09 NOTE — Progress Notes (Signed)
PULMONARY / CRITICAL CARE MEDICINE   Name: Wanda Hughes MRN: [id number] DOB: 01/06/1925    ADMISSION DATE:  03/01/2016 CONSULTATION DATE:  02/19/2016  REFERRING MD:  Criss Alvine  CHIEF COMPLAINT:  Cardiac Arrest  HISTORY OF PRESENT ILLNESS:  Pt is encephelopathic; therefore, this HPI is obtained from chart review. Wanda Hughes is a 82 y.o. female with PMH of significant Alzheimer's, TIA, SAH, Anxiety, HTN, HLD, Constipation, CKD.  She resides in a nursing home.  On 10/31, she was brought to [facility] ED with cardiac arrest.  She had apparently been given lunch and was normal then.  Roughly 2 - 3 hours later, she was found unresponsive in her bed without pulses.  CPR was initiated and EMS was dispatched. On EMS arrival, she was noted to be PEA.  She received roughly 15 minutes of ACLS measures including epi x 5 before ROSC.  She was intubated in the field and en route to ED, she unfortunately lost pulses once again.  She received an additional 5 minutes of ACLS before ROSC.  On arrival to [facility] ED, she was started on levophed for shock, was pan cultured, and given IVF.  On review of prior records, it is noted that she had recent admission last month for dehydration, FTT, severe constipation with impaction.  At the time, had discussion with palliative care and DNR orders placed.  I called pt's son Wanda Hughes who is HCPOA but Lewis stated that he did not recall any DNR discussions and asked that we continue to be aggressive with care.  I then relayed that while respecting his wishes, [name] is critically ill and her past history, current vitals, and physical exam all point towards extremely poor prognosis.  He asked if he could three way call his brother Wanda Fus so that I could have a discussion with both of them. I then had discussion with both sons and relayed the same above info.  While communicating with them, [name] began to show decerebrate posturing. I relayed this to the sons and explained my concern for  significant anoxic brain injury (in addition to further findings of no pupillary reflex, no corneals, no gag, no spontaneous respirations over vent).  I also expressed my concern for cardiogenic shock given her systolic BP of 60 despite levophed.  After extensive discussions, both Lewis and Wanda Fus agreed that their mother would not want such heroic measures under these circumstances.  In fact, Wanda Fus stated that he did seem to recall previous DNR orders from last admission and felt that his mother would certainly opt for this route if she were able to speak for herself right now.  I explained comfort care vs continuing with supportive care (with understanding of DNR and no escalation of pressors, etc).  Sons both asked that we continue full supportive care including cultures/abx/fluids in hopes that they can make it to [place] in time Wanda Fus and his other 2nd brother Gerlene Burdock live in [place], while 3rd brother Wanda Hughes lives in [place]).  Brothers are all making arrangements to make it to [facility] ASAP.    PAST MEDICAL HISTORY :  She  has no past medical history on file.  PAST SURGICAL HISTORY: She  has no past surgical history on file.  Allergies  Allergen Reactions  . Ace Inhibitors Other (See Comments)    Noted as an allergy; see other MRN: [id number]    No current facility-administered medications on file prior to encounter.    No current outpatient prescriptions on file prior  to encounter.    FAMILY HISTORY:  Her has no family status information on file.    SOCIAL HISTORY: She    REVIEW OF SYSTEMS:   Unable to obtain as pt is encephalopathic.  SUBJECTIVE:  On vent, unresponsive.  VITAL SIGNS: BP (!) 209/189 (BP Location: Left Arm)   Pulse 86   Temp 98.8 F (37.1 C) (Oral)   Resp (!) 22   Ht 5\' 2"  (1.575 m)   Wt 120 lb 13 oz (54.8 kg)   SpO2 100%   BMI 22.10 kg/m   HEMODYNAMICS:    VENTILATOR SETTINGS: Vent Mode: PRVC FiO2 (%):  [45 %-100 %] 45 % Set  Rate:  [15 bmp] 15 bmp Vt Set:  [480 mL] 480 mL PEEP:  [5 cmH20] 5 cmH20 Plateau Pressure:  [13 cmH20-24 cmH20] 24 cmH20  INTAKE / OUTPUT: I/O last 3 completed shifts: In: 2410.8 [I.V.:2140.8; NG/GT:60; IV Piggyback:210] Out: 360 [Urine:360]   PHYSICAL EXAMINATION: General: Chronically ill appearing, frail elderly female, critically ill. Neuro: On vent, Breathing over the vent now with corneal and gag reflex HEENT: [place]/AT. MM dry. Cardiovascular: RRR, no MRG Lungs: Respirations even and unlabored.  Coarse bilaterally. Abdomen: BS hypoacive soft, NT, ND. Musculoskeletal: Muscle wasting throughout.  No gross deformities, no edema.  Skin: Unstageable sacral wound with feculent material inside and surrounding.  LABS:  BMET  Recent Labs Lab 02/12/2016 1444 02/18/2016 1504 03/09/16 0057  NA 155* 156* 151*  K 4.7 4.6 3.5  CL 127* 126* 123*  CO2 18*  --  20*  BUN 21* 28* 22*  CREATININE 1.79* 1.60* 1.44*  GLUCOSE 189* 179* 146*    Electrolytes  Recent Labs Lab 02/27/2016 1444 03/09/16 0057  CALCIUM 7.5* 7.4*  MG  --  2.3  PHOS  --  3.8    CBC  Recent Labs Lab 02/21/2016 1444 02/27/2016 1504 03/09/16 0057  WBC 17.5*  --  29.1*  HGB 8.4* 8.8* 8.1*  HCT 28.5* 26.0* 26.3*  PLT 367  --  397    Coag's  Recent Labs Lab 03/04/2016 1444  INR 1.47    Sepsis Markers  Recent Labs Lab 02/14/2016 1502 02/17/2016 1838  LATICACIDVEN 6.43* 3.4*    ABG  Recent Labs Lab 02/29/2016 1444 03/09/16 0431  PHART 7.216* 7.444  PCO2ART 47.4 28.2*  PO2ART 209.0* 75.0*    Liver Enzymes  Recent Labs Lab 02/13/2016 1444  AST 711*  ALT 309*  ALKPHOS 126  BILITOT 1.1  ALBUMIN 1.7*    Cardiac Enzymes  Recent Labs Lab 02/08/2016 1444 02/23/2016 1838 03/09/16 0057  TROPONINI 0.05* 0.19* 0.13*    Glucose  Recent Labs Lab 02/12/2016 1744 02/16/2016 2121 03/09/16 0015 03/09/16 0315 03/09/16 0731  GLUCAP 188* 148* 134* 159* 103*    Imaging Ct Head Wo  Contrast  Result Date: 03/04/2016 CLINICAL DATA:  Found unresponsive in nursing facility. CPR. Patient is intubated. EXAM: CT HEAD WITHOUT CONTRAST TECHNIQUE: Contiguous axial images were obtained from the base of the skull through the vertex without intravenous contrast. COMPARISON:  None. FINDINGS: Brain: There is diffuse cerebral atrophy. Marked enlargement of the lateral ventricles is probably secondary to the atrophy. There is extensive low density throughout the left frontal lobe representing an infarct. Low-density throughout the periventricular and subcortical white matter suggesting chronic changes. There is no evidence for acute hemorrhage, mass lesion or midline shift. Vascular: Vascular calcifications in the vertebral arteries. Skull: No calvarial fracture. Sinuses/Orbits: Small amount of fluid in the right sphenoid sinus. Mild  mucosal disease in posterior left ethmoid air cells. Fluid in the bilateral mastoid air cells. Other: None IMPRESSION: Negative for acute hemorrhage. Age-indeterminate infarct involving the left frontal lobe. Diffuse cerebral atrophy with marked enlargement of the lateral ventricles. Ventriculomegaly is probably secondary to the atrophy. Extensive white matter disease probably represents chronic small vessel ischemic changes. Fluid in mastoid air cells and right sphenoid sinus. This may be related to the endotracheal tube. Electronically Signed   By: Richarda OverlieAdam  Henn M.D.   On: 03/03/2016 16:50   Dg Chest Port 1 View  Result Date: 03/09/2016 CLINICAL DATA:  Respiratory failure . EXAM: PORTABLE CHEST 1 VIEW COMPARISON:  03/03/2016 FINDINGS: Endotracheal tube noted chest at the carina. Proximal repositioning of approximately 2-3 cm suggested. NG tube noted with tip below left hemidiaphragm. Heart size normal. Diffuse bilateral dense pulmonary infiltrates and/or edema are noted. No pleural effusion or pneumothorax. IMPRESSION: 1. Endotracheal tube tip noted at the level the carina.  Proximal repositioning of approximately 2-3 cm suggested. NG tube noted with tip below left hemidiaphragm. 2.  Diffuse bilateral dense pulmonary infiltrates and/or edema. Critical Value/emergent results were called by telephone at the time of interpretation on 03/09/2016 at 7:25 am to nurse Crystal, who verbally acknowledged these results. Electronically Signed   By: Wanda Hughes  Register   On: 03/09/2016 07:28   Dg Chest Portable 1 View  Result Date: 02/13/2016 CLINICAL DATA:  Patient found unconscious EXAM: PORTABLE CHEST 1 VIEW COMPARISON:  None. FINDINGS: Portable view chest demonstrates left-sided pacer patch is. Partially visualized lower cervical spine hardware. Faintly visualized endotracheal tube tip just above the carina. Esophageal tube tip is below the diaphragm. Mild cardiomegaly with atherosclerosis. Moderate diffuse bilateral interstitial opacities suggest edema or possible diffuse pneumonia with more focal right infrahilar opacity. No effusion. No pneumothorax. IMPRESSION: 1. Endotracheal tube tip is near the carina. 2. Mild cardiomegaly. 3. Mild to moderate diffuse interstitial opacities suggest edema or diffuse infection. There is more focal infiltrate in the right medial lung base. 4. Atherosclerosis of the aorta. Electronically Signed   By: Jasmine PangKim  Fujinaga M.D.   On: 02/23/2016 15:10     STUDIES:  CXR 11/1 > mod diffuse opacities. Images reviewed CT head 10/31 > EEG 10/31 >  CULTURES: Blood 10/31 > Sputum 10/31 > Sacral wound 10/31 >  ANTIBIOTICS: Vanc 10/31 > Zosyn 10/31 >  SIGNIFICANT EVENTS: 10/31 > admitted after unwitnessed out of hospital cardiac arrest.  LINES/TUBES: ETT 10/31 >  DISCUSSION: 82 y.o. female with hx significant Alzheimer's and who resides in nursing home, admitted 10/31 after unwitnessed out of hospital PEA cardiac arrest.  Had roughly 15 minutes ACLS before ROSC then second episode of 5 minutes ACLS.  Intubated in field but [facility],  physical exam findings concerning for significant anoxic injury.  Extensive discussions with family (2 sons Wanda NethLewis and Wanda Hughes) and DNR orders placed.  ASSESSMENT / PLAN:  CARDIOVASCULAR A:  PEA cardiac arrest - out of hospital / unwitnessed.  Roughly 20 minutes total (2 separate occurrences CPR / ACLS). Shock - primarily cardiogenic due to above, but also can not rule out septic due to significant massive sacral decub. Hx HTN, HLD. DNR STATUS. P:  Continue IVF's. Continue norepi at current rate, no escalation. Trend troponins, lactate. Hold preadmission amlodipine, clonidine, ezetimibe, labetalol.  NEUROLOGIC A:   Acute encephalopathy - with concern for significant anoxic injury. Hx significant Alzheimer's, SAH, TIA, anxiety. P:   Sedation:  Fentanyl PRN / Midazolam PRN. Add propofol gtt RASS  goal: 0 to -1. Daily WUA. EEG. Hold preadmission cetirizine, memantine, trazodone.  PULMONARY A: Acute hypoxic respiratory failure - presumed due to aspiration followed by cardiac arrest. Presumed aspiration. P:   Full vent support. Assess ABG. Wean as able. VAP prevention measures. No SBT unless neuro status improves. Abx / cultures per ID section. CXR in AM.  RENAL A:   Hypernatremia. AoCKD. Hypocalcemia. P:   1/2 NS @ 100. 1g Ca gluconate. BMP in AM.  GASTROINTESTINAL A:   GI prophylaxis. Nutrition. Hx constipation. P:   SUP: Famotidine. NPO. Colace, Dulcolax.  HEMATOLOGIC A:   Anemia. VTE Prophylaxis. P:  Transfuse for Hgb < 7. SCD's / heparin. CBC in AM.  INFECTIOUS A:   Concern for sepsis - due to significant massive sacral decub. P:   Abx as above (vanc / zosyn).  Follow cultures as above.  ENDOCRINE A:   Hyperglycemia.   P:   SSI.  Family updated: See family discussion from yesterday. Pt is now DNR with no aggressive care. I spoke with her Andreas Newport again today. He is on flight to [facility] and will be here on afternoon on 11/1 for  meeting.   Interdisciplinary Family Meeting v Palliative Care Meeting:  Due by: 03/14/16. CC time: 35 mins.  Chilton Greathouse MD [facility] Pager [phone] If no answer or after 3pm call: [phone] 03/09/2016, 10:16 AM

## 2016-03-09 DEATH — deceased

## 2016-03-10 ENCOUNTER — Inpatient Hospital Stay (HOSPITAL_COMMUNITY): Payer: Medicare Other

## 2016-03-10 LAB — CBC
HEMATOCRIT: 28.7 % — AB (ref 36.0–46.0)
Hemoglobin: 8.8 g/dL — ABNORMAL LOW (ref 12.0–15.0)
MCH: 28.3 pg (ref 26.0–34.0)
MCHC: 30.7 g/dL (ref 30.0–36.0)
MCV: 92.3 fL (ref 78.0–100.0)
PLATELETS: 362 10*3/uL (ref 150–400)
RBC: 3.11 MIL/uL — ABNORMAL LOW (ref 3.87–5.11)
RDW: 16.4 % — AB (ref 11.5–15.5)
WBC: 20.7 10*3/uL — ABNORMAL HIGH (ref 4.0–10.5)

## 2016-03-10 LAB — BLOOD GAS, ARTERIAL
ACID-BASE DEFICIT: 4.2 mmol/L — AB (ref 0.0–2.0)
Bicarbonate: 19.5 mmol/L — ABNORMAL LOW (ref 20.0–28.0)
DRAWN BY: 426241
FIO2: 40
MECHVT: 480 mL
O2 Saturation: 94 %
PEEP/CPAP: 5 cmH2O
PO2 ART: 70.7 mmHg — AB (ref 83.0–108.0)
Patient temperature: 98.6
RATE: 15 resp/min
pCO2 arterial: 31 mmHg — ABNORMAL LOW (ref 32.0–48.0)
pH, Arterial: 7.415 (ref 7.350–7.450)

## 2016-03-10 LAB — BASIC METABOLIC PANEL
Anion gap: 6 (ref 5–15)
BUN: 19 mg/dL (ref 6–20)
CALCIUM: 7.4 mg/dL — AB (ref 8.9–10.3)
CO2: 19 mmol/L — AB (ref 22–32)
CREATININE: 1.4 mg/dL — AB (ref 0.44–1.00)
Chloride: 122 mmol/L — ABNORMAL HIGH (ref 101–111)
GFR calc Af Amer: 37 mL/min — ABNORMAL LOW (ref >60)
GFR calc non Af Amer: 32 mL/min — ABNORMAL LOW (ref >60)
GLUCOSE: 113 mg/dL — AB (ref 65–99)
Potassium: 3.5 mmol/L (ref 3.5–5.1)
Sodium: 147 mmol/L — ABNORMAL HIGH (ref 135–145)

## 2016-03-10 LAB — GLUCOSE, CAPILLARY
GLUCOSE-CAPILLARY: 107 mg/dL — AB (ref 65–99)
GLUCOSE-CAPILLARY: 132 mg/dL — AB (ref 65–99)
Glucose-Capillary: 117 mg/dL — ABNORMAL HIGH (ref 65–99)
Glucose-Capillary: 87 mg/dL (ref 65–99)

## 2016-03-10 LAB — TRIGLYCERIDES: Triglycerides: 183 mg/dL — ABNORMAL HIGH (ref <150)

## 2016-03-10 LAB — PHOSPHORUS: PHOSPHORUS: 3.3 mg/dL (ref 2.5–4.6)

## 2016-03-10 LAB — MAGNESIUM: MAGNESIUM: 2.3 mg/dL (ref 1.7–2.4)

## 2016-03-10 MED ORDER — SODIUM CHLORIDE 0.9 % IV SOLN: 250.0000 mL | INTRAVENOUS | Status: DC

## 2016-03-10 MED ORDER — ACETAMINOPHEN 650 MG RE SUPP: 650.0000 mg | RECTAL | Status: DC | PRN

## 2016-03-10 MED ORDER — FENTANYL 2500MCG IN NS 250ML (10MCG/ML) PREMIX INFUSION
25.0000 ug/h | INTRAVENOUS | Status: DC
  Administered 2016-03-10: 50 ug/h via INTRAVENOUS
  Filled 2016-03-10: qty 250

## 2016-03-10 MED ORDER — ATROPINE SULFATE 1 % OP SOLN
4.0000 [drp] | OPHTHALMIC | Status: DC | PRN
  Filled 2016-03-10: qty 2

## 2016-03-10 MED ORDER — FENTANYL BOLUS VIA INFUSION
50.0000 ug | INTRAVENOUS | Status: DC | PRN
  Filled 2016-03-10: qty 200

## 2016-03-10 MED ORDER — MIDAZOLAM HCL 5 MG/ML IJ SOLN
1.0000 mg/h | INTRAMUSCULAR | Status: DC
  Administered 2016-03-10: 2 mg/h via INTRAVENOUS
  Filled 2016-03-10: qty 10

## 2016-03-10 MED ORDER — MIDAZOLAM BOLUS VIA INFUSION (WITHDRAWAL LIFE SUSTAINING TX)
5.0000 mg | INTRAVENOUS | Status: DC | PRN
  Filled 2016-03-10: qty 20

## 2016-03-10 MED ORDER — MIDAZOLAM HCL 2 MG/2ML IJ SOLN: 1.0000 mg | INTRAMUSCULAR | Status: DC | PRN

## 2016-03-13 LAB — CULTURE, BLOOD (ROUTINE X 2): CULTURE: NO GROWTH

## 2016-03-14 LAB — CULTURE, BLOOD (ROUTINE X 2): Culture: NO GROWTH

## 2016-03-16 ENCOUNTER — Telehealth: Payer: Self-pay

## 2016-03-16 NOTE — Telephone Encounter (Signed)
On 03/16/2016 I received a death certificate from [employer] [facility] [employer] [facility] [employer] (original). The death certificate is for burial. The patient is a patient of Doctor Sood. The death certificate will be taken to Pulmonary Unit @ [facility] this am for signature.  On 03/16/2016 I received the death certificate back from [facility]. I got the death certificate ready and called the funeral home to let them know the death certificate is ready for pickup.

## 2016-04-08 NOTE — Progress Notes (Signed)
PULMONARY / CRITICAL CARE MEDICINE   Name: Wanda Hughes MRN: [id number] DOB: 1925-04-10    ADMISSION DATE:  03/04/2016 CONSULTATION DATE:  02/11/2016  REFERRING MD:  Criss AlvineGoldston  CHIEF COMPLAINT:  Cardiac Arrest  HISTORY OF PRESENT ILLNESS:  82 yo female from NH with cardiac arrest with PEA and 15 min for ROSC prior to arrival in ER.  Had recurrent cardiac arrest with ER with additional 5 minutes of CPR.  She had VDRF, cardiogenic shock.  Family agreed to DNR status.  PMHx of Alzheimer's dementia, SAH, Anxiety, HTN, HLD, CKD.  SUBJECTIVE:  Remains on vent  VITAL SIGNS: BP (!) 121/38   Pulse (!) 37   Temp 98.8 F (37.1 C) (Axillary)   Resp (!) 21   Ht 5\' 2"  (1.575 m)   Wt 124 lb 12.5 oz (56.6 kg)   SpO2 100%   BMI 22.82 kg/m   VENTILATOR SETTINGS: Vent Mode: PRVC FiO2 (%):  [40 %-45 %] 40 % Set Rate:  [15 bmp] 15 bmp Vt Set:  [480 mL] 480 mL PEEP:  [5 cmH20] 5 cmH20 Plateau Pressure:  [9 cmH20-17 cmH20] 16 cmH20  INTAKE / OUTPUT: I/O last 3 completed shifts: In: 5111.9 [I.V.:4751.9; NG/GT:60; IV Piggyback:300] Out: 950 [Urine:950]   PHYSICAL EXAMINATION: General: unresponsive Neuro: not following commands HEENT: ETT in place Cardiac: regular Chest: b/l crackles Abd: soft, non tender Ext: 1+ edema Skin: unstageable sacral wound >> present prior to admission  LABS:  BMET  Recent Labs Lab 03/01/2016 1444 02/13/2016 1504 03/09/16 0057 December 25, 2015 0500  NA 155* 156* 151* 147*  K 4.7 4.6 3.5 3.5  CL 127* 126* 123* 122*  CO2 18*  --  20* 19*  BUN 21* 28* 22* 19  CREATININE 1.79* 1.60* 1.44* 1.40*  GLUCOSE 189* 179* 146* 113*    Electrolytes  Recent Labs Lab 02/21/2016 1444 03/09/16 0057 December 25, 2015 0500  CALCIUM 7.5* 7.4* 7.4*  MG  --  2.3 2.3  PHOS  --  3.8 3.3    CBC  Recent Labs Lab 02/08/2016 1444 02/17/2016 1504 03/09/16 0057 December 25, 2015 0500  WBC 17.5*  --  29.1* 20.7*  HGB 8.4* 8.8* 8.1* 8.8*  HCT 28.5* 26.0* 26.3* 28.7*  PLT 367  --  397 362     Coag's  Recent Labs Lab 02/22/2016 1444  INR 1.47    Sepsis Markers  Recent Labs Lab 02/20/2016 1502 03/02/2016 1838  LATICACIDVEN 6.43* 3.4*    ABG  Recent Labs Lab 03/02/2016 1444 03/09/16 0431 December 25, 2015 0415  PHART 7.216* 7.444 7.415  PCO2ART 47.4 28.2* 31.0*  PO2ART 209.0* 75.0* 70.7*    Liver Enzymes  Recent Labs Lab 02/22/2016 1444  AST 711*  ALT 309*  ALKPHOS 126  BILITOT 1.1  ALBUMIN 1.7*    Cardiac Enzymes  Recent Labs Lab 02/21/2016 1444 03/02/2016 1838 03/09/16 0057  TROPONINI 0.05* 0.19* 0.13*    Glucose  Recent Labs Lab 03/09/16 1512 03/09/16 1954 December 25, 2015 0011 December 25, 2015 0416 December 25, 2015 0823 December 25, 2015 1112  GLUCAP 134* 121* 132* 117* 87 107*    Imaging Dg Chest Port 1 View  Result Date: 04/01/2016 CLINICAL DATA:  Acute respiratory failure. EXAM: PORTABLE CHEST 1 VIEW COMPARISON:  03/09/2016 FINDINGS: Endotracheal tube is roughly 1.7 cm above the carina. Improved aeration in both lungs. There are residual densities at the lung bases. Cardiac silhouette is grossly stable. Surgical plate in the lower cervical spine. Nasogastric tube extends into the abdomen. IMPRESSION: Improving aeration in the lungs with residual basilar densities. Findings could represent  decreased pulmonary edema. Electronically Signed   By: Richarda OverlieAdam  Henn M.D.   On: 03/17/2016 07:13     STUDIES:  CT head 10/31 > diffuse cerebral atrophy, old Lt frontal lobe, chronic small vessel ischemic changes EEG 10/31 > burst suppression with intermittent b/l periodic sharp waves  CULTURES: Urine 10/31 >> negative Blood 10/31 >>   ANTIBIOTICS: Vanc 10/31 > 11/02 Zosyn 10/31 > 11/02  SIGNIFICANT EVENTS: 10/31 admitted after unwitnessed out of hospital cardiac arrest.  LINES/TUBES: ETT 10/31 >  DISCUSSION: 82 yo female from NH with cardiac arrest resulting in anoxic encephalopathy.  ASSESSMENT:  PEA cardiac arrest Anoxic encephalopathy Acute hypoxic respiratory  failure Cardiogenic shock Hx of HTN Hx of Alzheimer's dementia Hx of CVA Aspiration pneumonia AKI CKD 3 Hypernatremia Moderate protein calorie malnutrition Anemic of critical illness and chronic disease Unstageable sacral ulcers >> present prior to admission Hyperglycemia  PLAN:  DNR No escalation of care Plan to transition to comfort care once her two sons have communicated with rest of family  CC time 31 minutes.  Coralyn HellingVineet Chastin Garlitz, MD [facility] 04/05/2016, 11:53 AM Pager:  [phone] After 3pm call: [phone]

## 2016-04-08 NOTE — Progress Notes (Signed)
Pt extubated and withdrawn to comfort care per sons request and MD order. Comfort measures ongoing for pt and two sons at bedside. Oil hand prints completed and provided to family. E link notified. Will continue to assess and comfort.

## 2016-04-08 NOTE — Care Management Note (Signed)
Case Management Note  Patient Details  Name: Wanda Hughes MRN: [id number] Date of Birth: 1924-06-17  Subjective/Objective:   PEA arrest,  Shock, aspiration, ARDS                Action/Plan: Discharge Planning[facility] CSW referral following for SNF. Will continue to follow for dc needs.    Expected Discharge Date:                  Expected Discharge Plan:  Skilled Nursing Facility  In-House Referral:  Clinical Social Work  Discharge planning Services  CM Consult  Post Acute Care Choice:  NA Choice offered to:  NA  DME Arranged:  N/A DME Agency:  NA  HH Arranged:  NA HH Agency:  NA  Status of Service:  In process, will continue to follow  If discussed at Long Length of Stay Meetings, dates discussed:    Additional Comments:  Elliot CousinShavis, Wanda Oliveri Ellen, RN 03/26/2016, 11:50 AM

## 2016-04-08 NOTE — Discharge Summary (Signed)
Wanda Hughes was a 82 y.o. female admitted on 02/14/2016 from NH with cardiac arrest with PEA and 15 min for ROSC prior to arrival in ER.  She had recurrent cardiac arrest in the ER with additional 5 minutes of CPR.  She had VDRF, cardiogenic shock.  Family agreed to DNR status.  PMHx of Alzheimer's dementia, SAH, Anxiety, HTN, HLD, CKD.  She developed myoclonus.  EEG showed burst suppression.  Family updated about poor prognosis.  Decision made to transition to comfort measures.  She was extubated on 12/21/2015 and subsequently expired at 1513.  Final Diagnoses: PEA cardiac arrest Anoxic encephalopathy Acute hypoxic respiratory failure Cardiogenic shock Hx of HTN Hx of Alzheimer's dementia Hx of CVA Aspiration pneumonia AKI CKD 3 Hypernatremia Moderate protein calorie malnutrition Anemic of critical illness and chronic disease Unstageable sacral ulcers >> present prior to admission Hyperglycemia  Wanda HellingVineet Sumit Branham, MD [facility] 03/11/2016, 11:28 AM

## 2016-04-08 NOTE — Procedures (Signed)
Extubation Procedure Note  Patient Details:   Name: Gerilyn PilgrimRose Ickes DOB: 08/05/1924 MRN: [id number]   Airway Documentation:     Evaluation  O2 sats: currently acceptable Complications: No apparent complications Patient did tolerate procedure well. Bilateral Breath Sounds: Diminished   No  Pt was terminally extubate. Family in waiting area  Melanee Spryelson, Chanelle Hodsdon Lawson 03/09/2016, 2:19 PM

## 2016-04-08 NOTE — Progress Notes (Addendum)
Pt pronounced 1513 with no HR or RR by 2 RNs per protocol. CDS notified. MD notified. Comfort measures provided & ongoing to family. 20ml IV versed and IV fentanyl wasted by 2RNs per protocol.

## 2016-04-08 DEATH — deceased

## 2017-11-08 IMAGING — CT CT HEAD W/O CM
3 of 4 series · 17 of 47 positions shown, 20 images · non-contrast
Comparison: None.

CLINICAL DATA: Found unresponsive in nursing facility. CPR. Patient
is intubated.

EXAM:
CT HEAD WITHOUT CONTRAST
TECHNIQUE: Contiguous axial images were obtained from the base of the skull
through the vertex without intravenous contrast.

[Series 201: head w/o, idose (1) · axial · non-contrast · 0.43mm/px · z∈[+387,+517]mm · 11 of 32 slices shown, 14 images]
[im 3/32  brain]
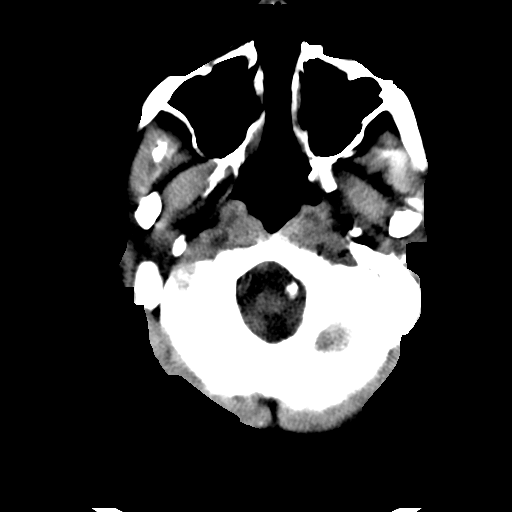
[im 3/32  bone]
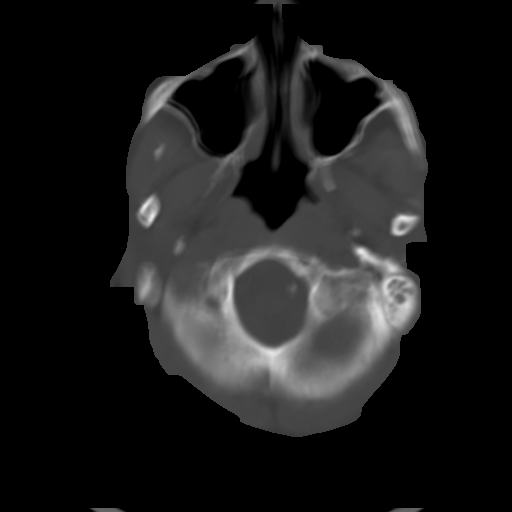
[im 5/32  brain]
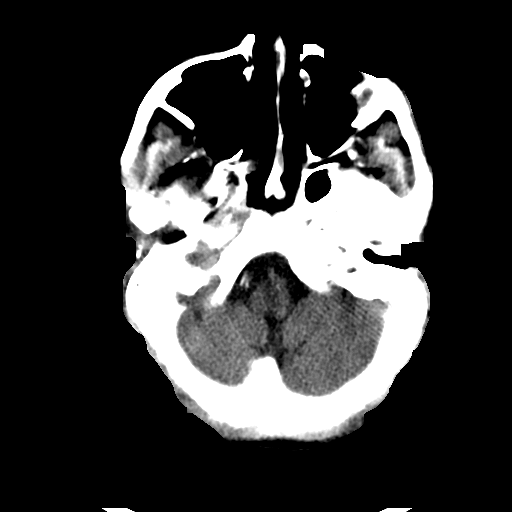
[im 7/32  brain]
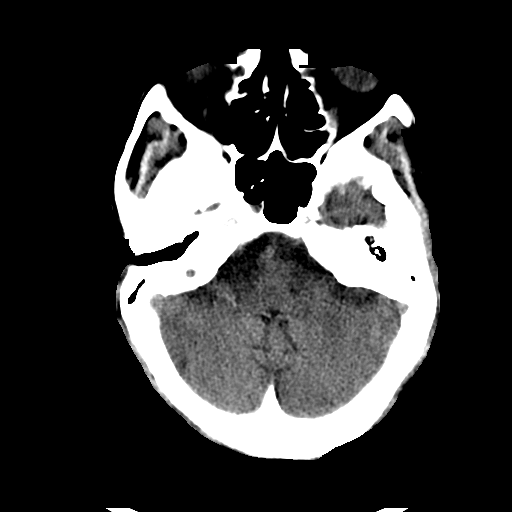
[im 12/32  brain]
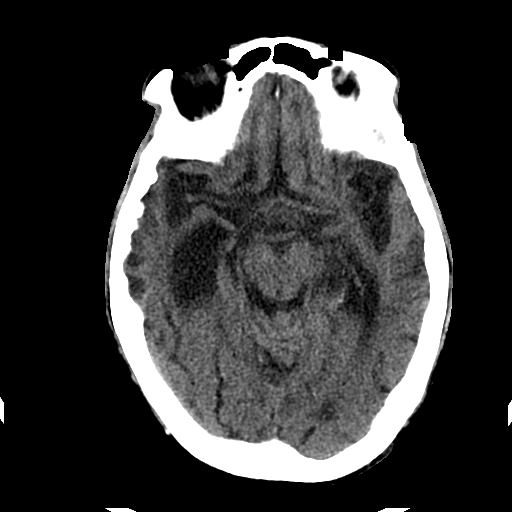
[im 14/32  brain]
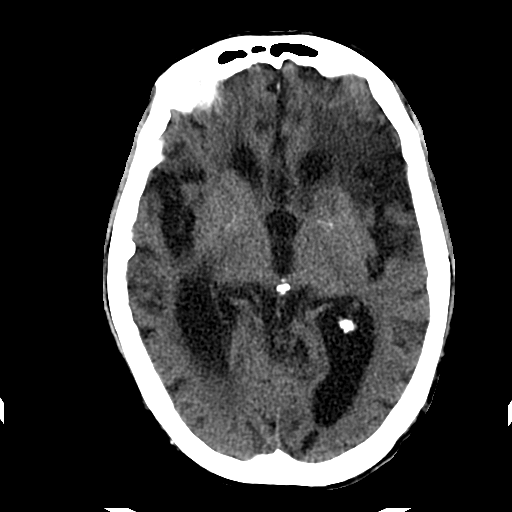
[im 14/32  bone]
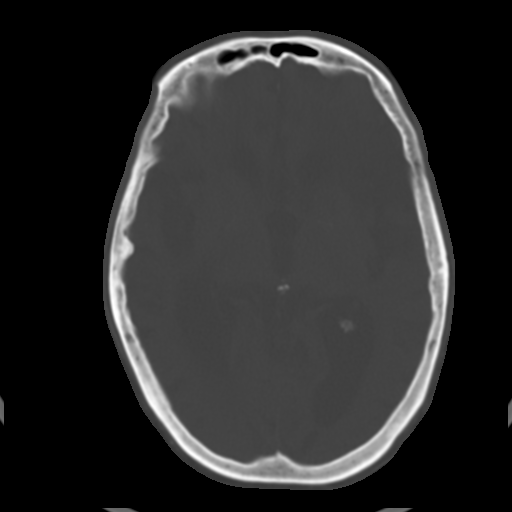
[im 16/32  brain]
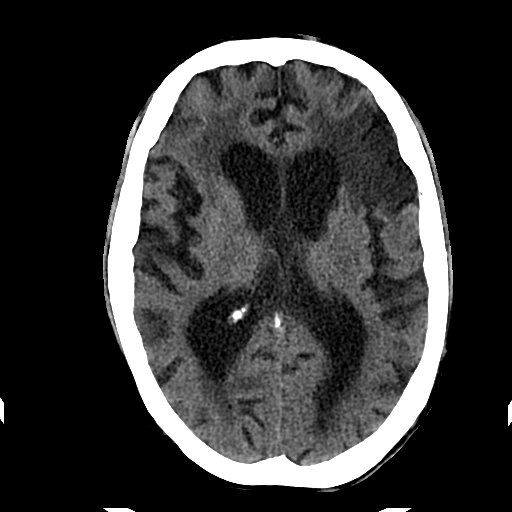
[im 18/32  brain]
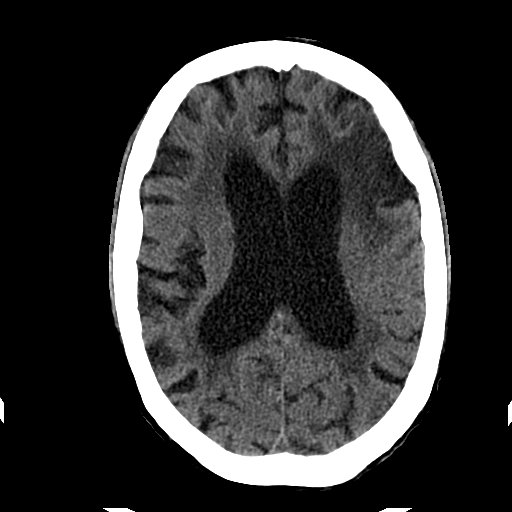
[im 20/32  brain]
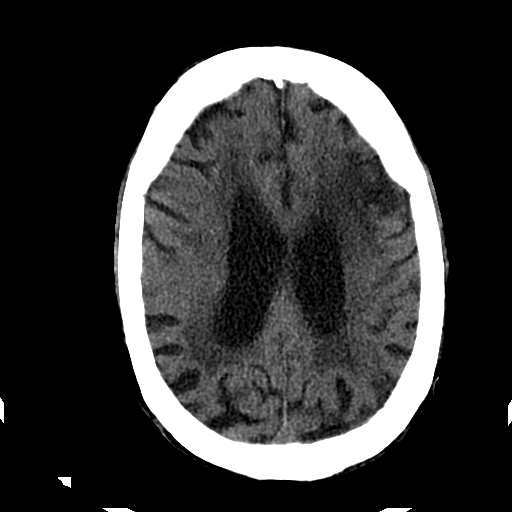
[im 25/32  brain]
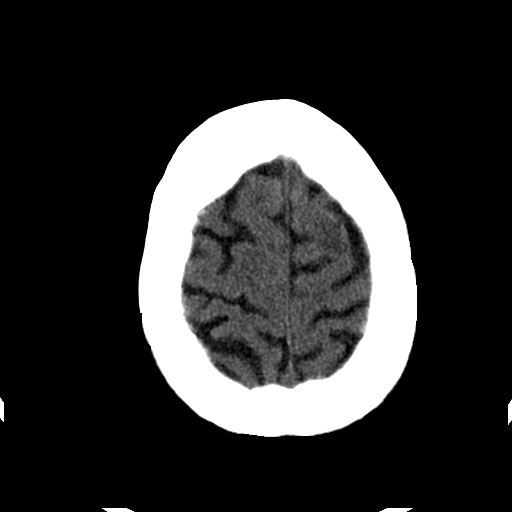
[im 25/32  bone]
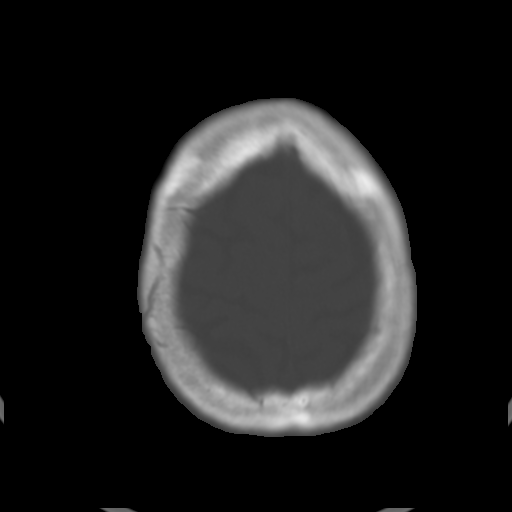
[im 27/32  brain]
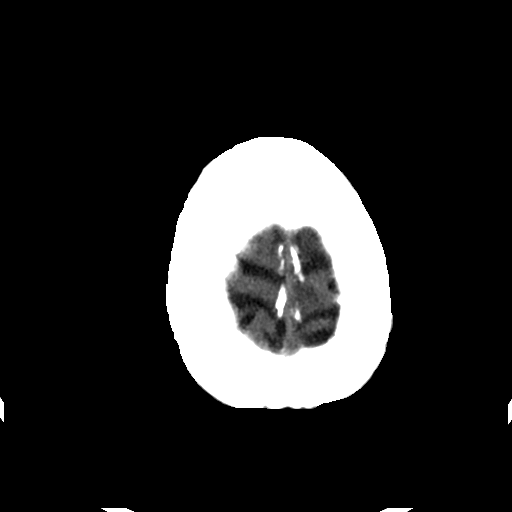
[im 29/32  brain]
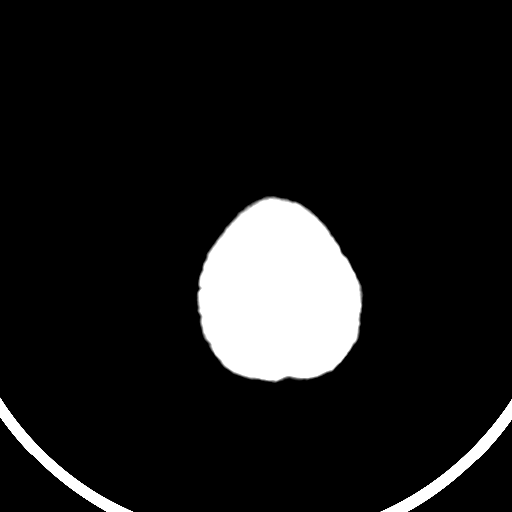

[Series 203: coronal st, idose (1) · coronal · 0.40mm/px · 3 of 73 slices shown]
[im 25/73  brain]
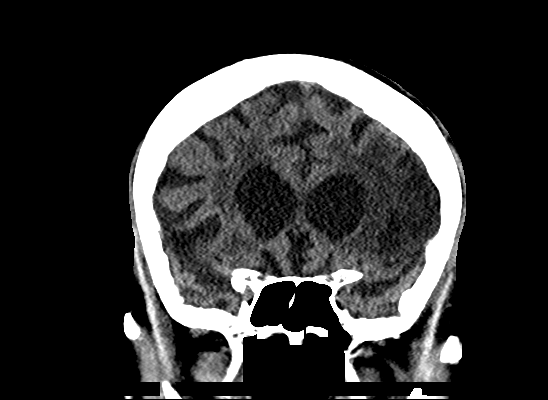
[im 33/73  brain]
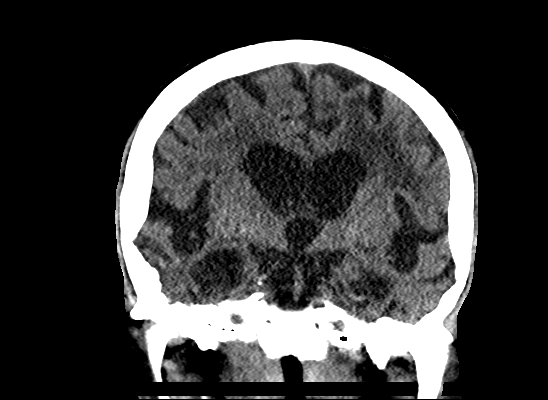
[im 41/73  brain]
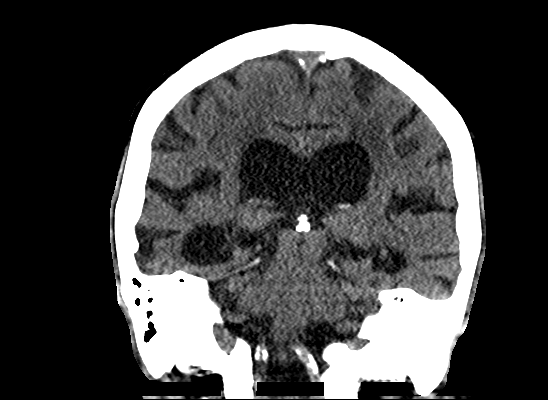

[Series 204: sagittal st, idose (1) · sagittal · 0.40mm/px · 3 of 72 slices shown]
[im 24/72  brain]
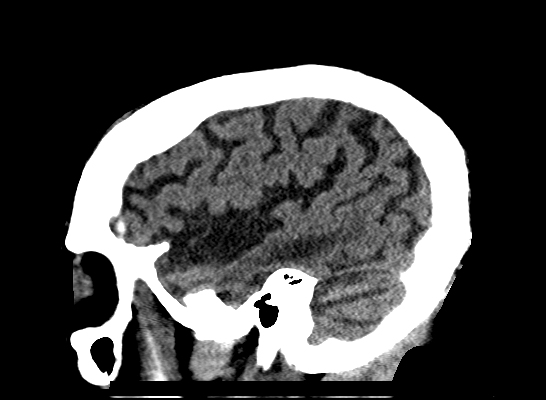
[im 36/72  brain]
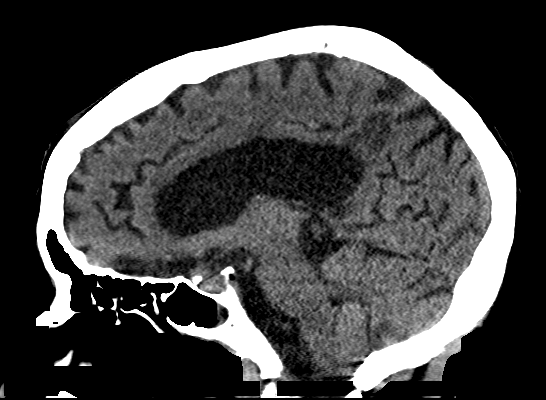
[im 48/72  brain]
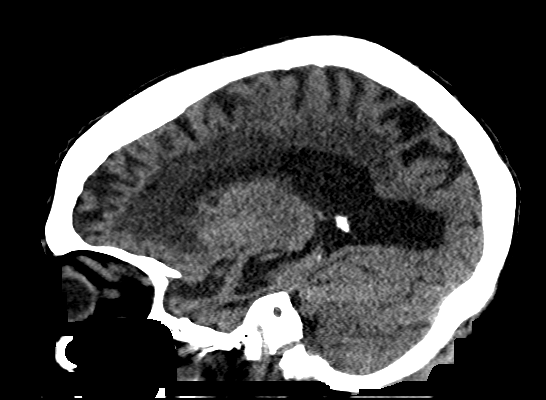

[17 of 47 positions shown; findings below may reference images not displayed]

FINDINGS: Brain: There is diffuse cerebral atrophy. Marked enlargement of the
lateral ventricles is probably secondary to the atrophy. There is
extensive low density throughout the left frontal lobe representing
an infarct. Low-density throughout the periventricular and
subcortical white matter suggesting chronic changes. There is no
evidence for acute hemorrhage, mass lesion or midline shift.

Vascular: Vascular calcifications in the vertebral arteries.

Skull: No calvarial fracture.

Sinuses/Orbits: Small amount of fluid in the right sphenoid sinus.
Mild mucosal disease in posterior left ethmoid air cells. Fluid in
the bilateral mastoid air cells.

Other: None
IMPRESSION: Negative for acute hemorrhage.

Age-indeterminate infarct involving the left frontal lobe.

Diffuse cerebral atrophy with marked enlargement of the lateral
ventricles. Ventriculomegaly is probably secondary to the atrophy.

Extensive white matter disease probably represents chronic small
vessel ischemic changes.

Fluid in mastoid air cells and right sphenoid sinus. This may be
related to the endotracheal tube.

## 2017-11-09 IMAGING — CR DG CHEST 1V PORT
1 series · 1 of 1 positions shown · non-contrast
Comparison: 03/08/2016

CLINICAL DATA: Respiratory failure .

EXAM:
PORTABLE CHEST 1 VIEW

[AP]
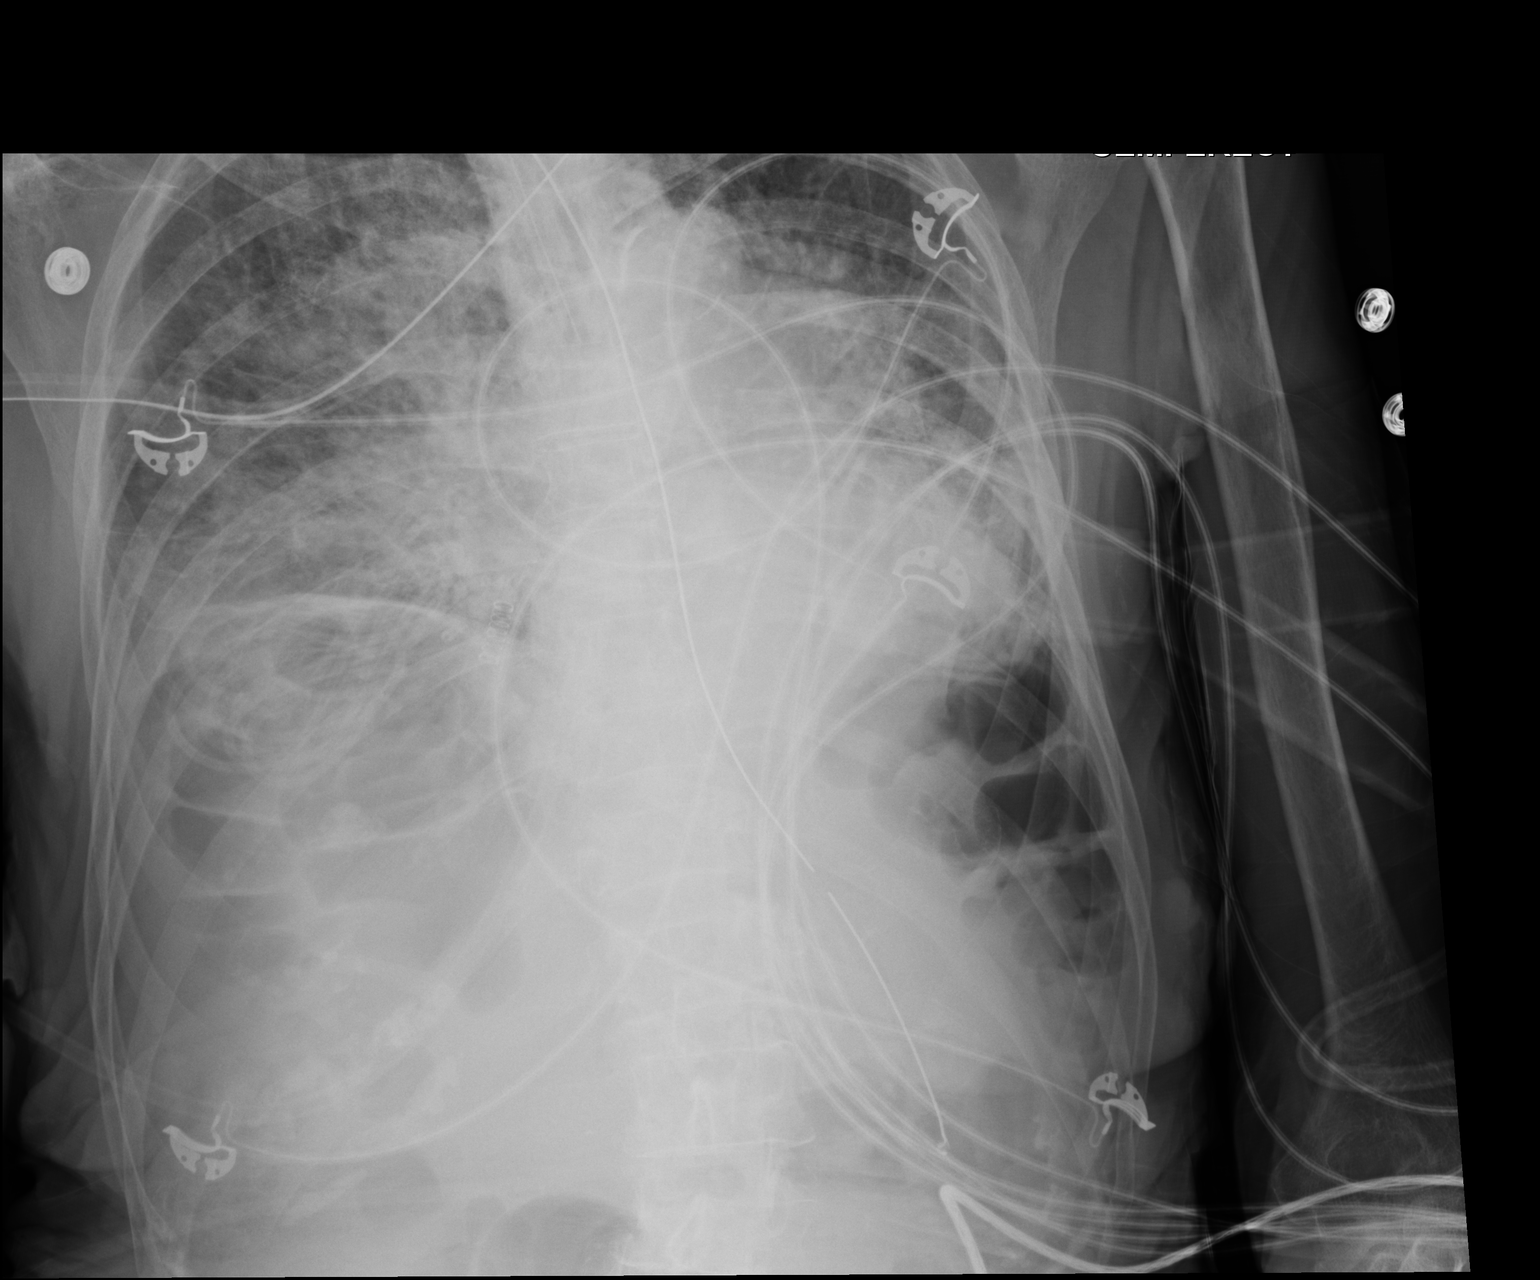

[1 of 1 positions shown; findings below may reference images not displayed]

FINDINGS: Endotracheal tube noted chest at the carina. Proximal repositioning
of approximately 2-3 cm suggested. NG tube noted with tip below left
hemidiaphragm. Heart size normal. Diffuse bilateral dense pulmonary
infiltrates and/or edema are noted. No pleural effusion or
pneumothorax.
IMPRESSION: 1. Endotracheal tube tip noted at the level the carina. Proximal
repositioning of approximately 2-3 cm suggested. NG tube noted with
tip below left hemidiaphragm.

2.  Diffuse bilateral dense pulmonary infiltrates and/or edema.

Critical Value/emergent results were called by telephone at the time
of interpretation on 03/09/2016 at [DATE] to nurse [HOSPITAL], who
verbally acknowledged these results.
